# Patient Record
Sex: Male | Born: 1960 | Race: Asian | Hispanic: No | Marital: Married | Smoking: Never smoker
Health system: Southern US, Community
[De-identification: ages and names within clinical notes are randomized; demographics above are authoritative.]

## PROBLEM LIST (undated history)

## (undated) DIAGNOSIS — E785 Hyperlipidemia, unspecified: Secondary | ICD-10-CM

## (undated) DIAGNOSIS — T7840XA Allergy, unspecified, initial encounter: Secondary | ICD-10-CM

## (undated) DIAGNOSIS — Z21 Asymptomatic human immunodeficiency virus [HIV] infection status: Secondary | ICD-10-CM

## (undated) DIAGNOSIS — J302 Other seasonal allergic rhinitis: Secondary | ICD-10-CM

## (undated) DIAGNOSIS — B20 Human immunodeficiency virus [HIV] disease: Secondary | ICD-10-CM

## (undated) DIAGNOSIS — J479 Bronchiectasis, uncomplicated: Secondary | ICD-10-CM

## (undated) DIAGNOSIS — R918 Other nonspecific abnormal finding of lung field: Secondary | ICD-10-CM

## (undated) HISTORY — DX: Other nonspecific abnormal finding of lung field: R91.8

## (undated) HISTORY — DX: Allergy, unspecified, initial encounter: T78.40XA

## (undated) HISTORY — DX: Hyperlipidemia, unspecified: E78.5

## (undated) HISTORY — DX: Bronchiectasis, uncomplicated: J47.9

## (undated) HISTORY — DX: Human immunodeficiency virus (HIV) disease: B20

## (undated) HISTORY — DX: Asymptomatic human immunodeficiency virus (hiv) infection status: Z21

---

## 2010-11-16 DIAGNOSIS — E119 Type 2 diabetes mellitus without complications: Secondary | ICD-10-CM | POA: Insufficient documentation

## 2010-11-16 DIAGNOSIS — E781 Pure hyperglyceridemia: Secondary | ICD-10-CM | POA: Insufficient documentation

## 2011-07-27 ENCOUNTER — Emergency Department (HOSPITAL_COMMUNITY)
Admission: EM | Admit: 2011-07-27 | Discharge: 2011-07-28 | Disposition: A | Discharge status: Home / Self Care | Payer: BC Managed Care – PPO | Attending: Emergency Medicine | Admitting: Emergency Medicine

## 2011-07-27 ENCOUNTER — Ambulatory Visit (INDEPENDENT_AMBULATORY_CARE_PROVIDER_SITE_OTHER): Payer: BC Managed Care – PPO | Admitting: Emergency Medicine

## 2011-07-27 ENCOUNTER — Encounter (HOSPITAL_COMMUNITY): Payer: Self-pay | Admitting: *Deleted

## 2011-07-27 VITALS — BP 104/70 | HR 68 | Temp 98.6°F | Resp 16 | Ht 64.0 in | Wt 153.4 lb

## 2011-07-27 DIAGNOSIS — Z880 Allergy status to penicillin: Secondary | ICD-10-CM | POA: Insufficient documentation

## 2011-07-27 DIAGNOSIS — R209 Unspecified disturbances of skin sensation: Secondary | ICD-10-CM | POA: Insufficient documentation

## 2011-07-27 DIAGNOSIS — G5610 Other lesions of median nerve, unspecified upper limb: Secondary | ICD-10-CM

## 2011-07-27 DIAGNOSIS — R200 Anesthesia of skin: Secondary | ICD-10-CM

## 2011-07-27 DIAGNOSIS — E119 Type 2 diabetes mellitus without complications: Secondary | ICD-10-CM | POA: Insufficient documentation

## 2011-07-27 HISTORY — DX: Other seasonal allergic rhinitis: J30.2

## 2011-07-27 LAB — POCT I-STAT, CHEM 8
BUN: 17 mg/dL (ref 6–23)
Potassium: 3.9 mEq/L (ref 3.5–5.1)
Sodium: 136 mEq/L (ref 135–145)
TCO2: 27 mmol/L (ref 0–100)

## 2011-07-27 LAB — GLUCOSE, CAPILLARY: Glucose-Capillary: 240 mg/dL — ABNORMAL HIGH (ref 70–99)

## 2011-07-27 NOTE — ED Notes (Signed)
Pt c/o bilateral hand numbness since 6 am this morning, numb on the interior aspects only.  No weakness, facial droop, arm drift, slurred speech.

## 2011-07-27 NOTE — ED Notes (Addendum)
Pt up to desk to complain about delays.  Apologized for delays.  Encouraged pt to stay.  Explained several times triage process and importance of staying to be seen.  Pt deciding if he will stay or leave.

## 2011-07-27 NOTE — ED Provider Notes (Signed)
History     CSN: [id number]  Arrival date & time 07/27/11  2013   First MD Initiated Contact with Patient 07/27/11 2255      Chief Complaint  Patient presents with  . Numbness    (Consider location/radiation/quality/duration/timing/severity/associated sxs/prior treatment) HPI Comments: 51 year old male with history of diabetes who takes metformin for hyperglycemia more days than  Not.  He presents with a complaint of bilateral hand numbness right greater than left onset with awakening this morning approximately 16 hours ago. Initially the symptoms were intense but over the course of the day they have gradually improved and currently his symptoms are very mild. He describes the numbness as palmar of bilateral hands, involving the thumb, point her finger and middle finger. There is no involvement of the ring or pinkie fingers. He denies any changes in vision, numbness or weakness of the legs or weakness of the arms. He works on a Geographical information systems officer and has been able to work all day without any difficulty with using his fingers with respect coordination or strength. There is been no change in gait, balance, hearing and has no vertigo or headache. He denies chest pain, palpitations, shortness of breath, coughing, sore throat, rash, diarrhea, dysuria. He denies recent travel, fevers or chills. He has never had any symptoms like this in the past. He suspects that when he slept last night he slept on his arms in an abnormal way. He denies any alcohol use or tobacco use or drug use. He denies over-the-counter medications but does take occasional Zyrtec for allergies.  The history is provided by the patient.    Past Medical History  Diagnosis Date  . Diabetes mellitus   . Seasonal allergies     History reviewed. No pertinent past surgical history.  History reviewed. No pertinent family history.  History  Substance Use Topics  . Smoking status: Never Smoker   . Smokeless tobacco: Not on file    . Alcohol Use: No      Review of Systems  All other systems reviewed and are negative.    Allergies  Amoxicillin  Home Medications   Current Outpatient Rx  Name Route Sig Dispense Refill  . CETIRIZINE HCL 10 MG PO TABS Oral Take 10 mg by mouth daily as needed. For allergy symptoms      BP 116/86  Pulse 75  Temp 97.7 F (36.5 C) (Oral)  Resp 18  SpO2 99%  Physical Exam  Nursing note and vitals reviewed. Constitutional: He appears well-developed and well-nourished. No distress.  HENT:  Head: Normocephalic and atraumatic.  Mouth/Throat: Oropharynx is clear and moist. No oropharyngeal exudate.  Eyes: Conjunctivae and EOM are normal. Pupils are equal, round, and reactive to light. Right eye exhibits no discharge. Left eye exhibits no discharge. No scleral icterus.  Neck: Normal range of motion. Neck supple. No JVD present. No thyromegaly present.  Cardiovascular: Normal rate, regular rhythm, normal heart sounds and intact distal pulses.  Exam reveals no gallop and no friction rub.   No murmur heard. Pulmonary/Chest: Effort normal and breath sounds normal. No respiratory distress. He has no wheezes. He has no rales.  Abdominal: Soft. Bowel sounds are normal. He exhibits no distension and no mass. There is no tenderness.  Musculoskeletal: Normal range of motion. He exhibits no edema and no tenderness.  Lymphadenopathy:    He has no cervical adenopathy.  Neurological: He is alert. Coordination normal.       Neurologic exam:  Speech clear, pupils equal round  reactive to light, extraocular movements intact  Normal peripheral visual fields Cranial nerves III through XII normal including no facial droop Follows commands, moves all extremities x4, normal strength to bilateral upper and lower extremities at all major muscle groups including grip Sensation normal to light touch and pinprick Coordination intact, no limb ataxia, finger-nose-finger normal Rapid alternating  movements normal No pronator drift Gait normal  There is no change in sensation between the right and left hands, all fingers and the palms feel the same bilaterally and he denies any numbness to light touch or pinprick. Lower extremities with normal sensation, position sense and temperature sensation in all dermatomes.  Skin: Skin is warm and dry. No rash noted. No erythema.  Psychiatric: He has a normal mood and affect. His behavior is normal.    ED Course  Procedures (including critical care time)  Labs Reviewed  GLUCOSE, CAPILLARY - Abnormal; Notable for the following:    Glucose-Capillary 240 (*)     All other components within normal limits  POCT I-STAT, CHEM 8 - Abnormal; Notable for the following:    Glucose, Bld 280 (*)     All other components within normal limits   No results found.   1. Numbness of fingers of both hands       MDM  The patient appears well, has normal vital signs and has no focal neurologic deficits on my exam. We'll check his potassium level but at this time with bilateral hand symptoms there is not appear to be any focal deficits with which would be concerning for a stroke. He would benefit from neurology referral should his symptoms persist or change and I have discussed with him at length the need to return to the emergency department for unilateral or severe or worsening symptoms. Again he has no weakness or numbness in the dermatomal patterns of the upper extremities or the radial ulnar or median nerves bilaterally.  Vital signs remain normal, laboratory data reveals no hypokalemia, no renal dysfunction and mild to moderate hyperglycemia. The patient is having no other symptoms including no nausea, no weakness and has no other focal neurologic deficits. I explained to him in detail the indications for return and he has agreed.      Vida Roller, MD 07/28/11 [phone]

## 2011-07-27 NOTE — ED Notes (Signed)
Pt has decided to stay and be seen.

## 2011-07-27 NOTE — Progress Notes (Signed)
  Subjective:    Patient ID: Marciano Mundt, male    DOB: 05-Jan-1960, 51 y.o.   MRN: [id number]  HPI Comments: Awoke this morning as normal and noticed that both his hands were numb in a median nerve distribution.  His left hand improved but not fully through the day.  The right has not.  He has no history of injury or overuse.  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  No other neuro or motor symptoms.  No visual symptoms.  Has not history of antecedent illness or injury.  Has two month history of pain in right shoulder posteriorly that comes and goes.  Hand Pain  The incident occurred 6 to 12 hours ago. The incident occurred at home. There was no injury mechanism. The pain is present in the right hand. The quality of the pain is described as aching. The pain does not radiate. The pain is at a severity of 2/10. The pain is mild. The pain has been improving since the incident. Associated symptoms include muscle weakness, numbness and tingling. Pertinent negatives include no chest pain. Nothing aggravates the symptoms. He has tried nothing for the symptoms.      Review of Systems  HENT: Negative.   Eyes: Negative.   Respiratory: Negative.   Cardiovascular: Negative.  Negative for chest pain.  Gastrointestinal: Negative.   Genitourinary: Negative.   Musculoskeletal: Negative.   Neurological: Positive for tingling, weakness and numbness. Negative for dizziness, tremors, seizures, syncope, facial asymmetry, speech difficulty, light-headedness and headaches.       Objective:   Physical Exam  Constitutional: He is oriented to person, place, and time. He appears well-developed and well-nourished.  HENT:  Head: Normocephalic and atraumatic.  Eyes: Conjunctivae are normal. Pupils are equal, round, and reactive to light.  Neck: Normal range of motion.  Cardiovascular: Normal rate and regular rhythm.   Pulmonary/Chest: Effort normal and breath sounds normal.    Musculoskeletal: Normal range of motion. He exhibits tenderness (trapezius right posterior shoulder).  Neurological: He is alert and oriented to person, place, and time. He has normal reflexes. No cranial nerve deficit. He exhibits normal muscle tone. Coordination normal.       phalen and tinnels negative  Skin: Skin is warm and dry.          Assessment & Plan:  Median neuropathy bilaterally To ER for evaluation.

## 2011-07-28 NOTE — ED Notes (Signed)
Pt discharged home in good condition. 

## 2013-11-19 ENCOUNTER — Other Ambulatory Visit: Payer: Self-pay | Admitting: Family Medicine

## 2013-11-19 DIAGNOSIS — R9389 Abnormal findings on diagnostic imaging of other specified body structures: Secondary | ICD-10-CM

## 2014-04-02 ENCOUNTER — Other Ambulatory Visit: Payer: Self-pay

## 2014-05-15 LAB — BASIC METABOLIC PANEL
BUN: 9 mg/dL (ref 4–21)
Creatinine: 1.1 mg/dL (ref 0.6–1.3)
Glucose: 150 mg/dL
Potassium: 4.4 mmol/L (ref 3.4–5.3)
SODIUM: 135 mmol/L — AB (ref 137–147)

## 2014-05-15 LAB — HEPATIC FUNCTION PANEL
ALT: 20 U/L (ref 10–40)
AST: 21 U/L (ref 14–40)
Alkaline Phosphatase: 82 U/L (ref 25–125)
Bilirubin, Total: 0.3 mg/dL

## 2014-05-15 LAB — LIPID PANEL: CHOLESTEROL: 137 mg/dL (ref 0–200)

## 2014-05-15 LAB — HEMOGLOBIN A1C: Hgb A1c MFr Bld: 7.5 % — AB (ref 4.0–6.0)

## 2014-06-24 ENCOUNTER — Ambulatory Visit: Payer: BLUE CROSS/BLUE SHIELD

## 2014-06-24 DIAGNOSIS — B20 Human immunodeficiency virus [HIV] disease: Secondary | ICD-10-CM

## 2014-06-24 LAB — HEPATITIS A ANTIBODY, IGM: Hep A IgM: NONREACTIVE

## 2014-06-24 LAB — HEPATITIS B CORE ANTIBODY, TOTAL: Hep B Core Total Ab: NONREACTIVE

## 2014-06-24 LAB — HEPATITIS C ANTIBODY: HCV AB: NEGATIVE

## 2014-06-24 LAB — RPR

## 2014-06-25 LAB — T-HELPER CELL (CD4) - (RCID CLINIC ONLY)
CD4 T CELL ABS: 450 /uL (ref 400–2700)
CD4 T CELL HELPER: 25 % — AB (ref 33–55)

## 2014-06-25 LAB — HIV-1 RNA ULTRAQUANT REFLEX TO GENTYP+

## 2014-06-25 NOTE — Progress Notes (Signed)
Patient is transferring from [place] . He was diagnosed with HIV 2006 and is not sure of source of infection. He thinks it was related to a malaria exposure that took place while he was in [place] in 2000. He has been married over 20 years and his wife and 2 children have tested negative.    Patient has concerns about duplicate labs since he has a primary care and diabetes physician.  He has requested we only do a limited number of labs.  I have asked him to assure his routine labs with these physicians be forwarded to our office.   Laurell Josephs, RN

## 2014-06-26 LAB — QUANTIFERON TB GOLD ASSAY (BLOOD)
Interferon Gamma Release Assay: NEGATIVE
MITOGEN VALUE: 2.82 [IU]/mL
Quantiferon Nil Value: 0.07 IU/mL
Quantiferon Tb Ag Minus Nil Value: 0 IU/mL
TB Ag value: 0.07 IU/mL

## 2014-06-27 ENCOUNTER — Inpatient Hospital Stay: Admission: RE | Admit: 2014-06-27 | Payer: Self-pay | Source: Ambulatory Visit

## 2014-07-01 LAB — HLA B*5701: HLA-B*5701 w/rflx HLA-B High: POSITIVE — AB

## 2014-07-14 ENCOUNTER — Ambulatory Visit: Payer: BLUE CROSS/BLUE SHIELD | Admitting: Infectious Disease

## 2014-07-18 ENCOUNTER — Ambulatory Visit
Admission: RE | Admit: 2014-07-18 | Discharge: 2014-07-18 | Disposition: A | Discharge status: Home / Self Care | Payer: BLUE CROSS/BLUE SHIELD | Source: Ambulatory Visit | Attending: Family Medicine | Admitting: Family Medicine

## 2014-07-18 DIAGNOSIS — R9389 Abnormal findings on diagnostic imaging of other specified body structures: Secondary | ICD-10-CM

## 2014-07-18 MED ORDER — IOPAMIDOL (ISOVUE-300) INJECTION 61%
75.0000 mL | Freq: Once | INTRAVENOUS | Status: AC | PRN
  Administered 2014-07-18: 75 mL via INTRAVENOUS

## 2014-07-20 ENCOUNTER — Encounter: Payer: Self-pay | Admitting: Internal Medicine

## 2014-07-20 DIAGNOSIS — B20 Human immunodeficiency virus [HIV] disease: Secondary | ICD-10-CM | POA: Insufficient documentation

## 2014-07-20 DIAGNOSIS — J302 Other seasonal allergic rhinitis: Secondary | ICD-10-CM | POA: Insufficient documentation

## 2014-07-21 ENCOUNTER — Encounter: Payer: Self-pay | Admitting: Internal Medicine

## 2014-07-21 ENCOUNTER — Ambulatory Visit (INDEPENDENT_AMBULATORY_CARE_PROVIDER_SITE_OTHER): Payer: BLUE CROSS/BLUE SHIELD | Admitting: Internal Medicine

## 2014-07-21 VITALS — BP 100/67 | HR 64 | Temp 98.1°F | Resp 14 | Ht 65.0 in | Wt 148.0 lb

## 2014-07-21 DIAGNOSIS — B20 Human immunodeficiency virus [HIV] disease: Secondary | ICD-10-CM | POA: Diagnosis not present

## 2014-07-21 DIAGNOSIS — J302 Other seasonal allergic rhinitis: Secondary | ICD-10-CM

## 2014-07-21 NOTE — Progress Notes (Signed)
Patient ID: Jordan Holmes, male   DOB: [birth year], 54 y.o.   MRN: [id number]          Patient Active Problem List   Diagnosis Date Noted  . HIV disease 07/20/2014    Priority: High  . Seasonal allergies 07/20/2014  . Hypertriglyceridemia 11/16/2010  . Diabetes mellitus, type 2 11/16/2010    Patient's Medications  New Prescriptions   No medications on file  Previous Medications   ATORVASTATIN (LIPITOR) 10 MG TABLET    Take 10 mg by mouth daily.   CETIRIZINE (ZYRTEC) 10 MG TABLET    Take 10 mg by mouth daily as needed. For allergy symptoms   EFAVIRENZ-EMTRICITABINE-TENOFOVIR (ATRIPLA) 600-200-300 MG PER TABLET    Take 1 tablet by mouth at bedtime.   METFORMIN (GLUCOPHAGE) 500 MG TABLET    Take 500 mg by mouth 2 (two) times daily with a meal.   OMEGA-3 FATTY ACIDS (FISH OIL) 1200 MG CPDR    Take 12,000 mg by mouth daily.  Modified Medications   No medications on file  Discontinued Medications   No medications on file    Subjective: Jordan Holmes is in for his initial visit. He was diagnosed with HIV infection and 2006 when he was admitted to [facility] because of unexplained nausea and vomiting. He states that he and his wife were in "shock" when he was first diagnosed and did not believe what the doctors were telling him. He asked that he be retested and he was positive again. He has never had a blood transfusion and he has never used any illicit drugs. He has never had any tattoos. He states that he and his wife have never had any other sexual partners. He was born in [place] but spent 3 years living in [place] where he had malaria and received multiple injections. He wonders if that might have been how he acquired HIV infection. He has no history of any other sexually transmitted diseases or hepatitis.  Shortly after being diagnosed he started on Atripla and has taken it ever since. He rarely misses doses. He takes it just before bedtime. He had crazy dreams when he first  started taking it but tolerates it very well now. He also has type 2 diabetes, elevated cholesterol and seasonal allergies. He has never had any type surgery. He has never used tobacco products or alcohol. 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 His wife and 2 children have been tested and are HIV negative.  Review of Systems: Constitutional: negative Eyes: negative Ears, nose, mouth, throat, and face: negative Respiratory: negative Cardiovascular: negative Gastrointestinal: negative Genitourinary:negative  Past Medical History  Diagnosis Date  . Diabetes mellitus   . Seasonal allergies   . HIV infection   . Allergy   . Hyperlipidemia     History  Substance Use Topics  . Smoking status: Never Smoker   . Smokeless tobacco: Never Used  . Alcohol Use: No    History reviewed. No pertinent family history.  Allergies  Allergen Reactions  . Amoxicillin Nausea And Vomiting    Fever and vomiting Diarrhea & fever    Objective: Temp: 98.1 F (36.7 C) (07/18 0936) Temp Source: Oral (07/18 0936) BP: 100/67 mmHg (07/18 0936) Pulse Rate: 64 (07/18 0936) Body mass index is 24.63 kg/(m^2).  General: He is alert, well dressed and in no distress. His weight is 148 pounds. Oral: No oropharyngeal lesions Skin: No rash Lymph nodes: No palpable adenopathy Lungs: Clear Cor: Regular S1 and S2 with  no murmurs Abdomen: Soft and nontender with no palpable masses. Joints and extremities: Normal Neuro: Normal speech and conversation Mood: Normal and appropriate. He does not appear anxious or depressed.  Lab Results Lab Results  Component Value Date   HGB 16.3 07/27/2011   HCT 48.0 07/27/2011    Lab Results  Component Value Date   CREATININE 1.1 05/15/2014   BUN 9 05/15/2014   NA 135* 05/15/2014   K 4.4 05/15/2014   CL 98 07/27/2011    Lab Results  Component Value Date   ALT 20 05/15/2014   AST 21 05/15/2014   ALKPHOS 82 05/15/2014    Lab Results  Component Value Date    CHOL 137 05/15/2014    Lab Results HIV 1 RNA QUANT (copies/mL)  Date Value  06/24/2014 <20   CD4 T CELL ABS (/uL)  Date Value  06/24/2014 450     Assessment: His HIV infection is under excellent control with Atripla. I talked to him about treatment options that might be safer with newer preparations of tenofovir alafenamide that reduced the risk of nephrotoxicity and osteoporosis associated with the tenofovir component in Atripla. Have given him written information about Genvoya and asked him to consider a switch. For now, he will continue Atripla. I have asked him to try not to miss any doses. I've encouraged him to increase his physical activity. He currently walks a few times each week. He states that he is concerned about decreasing muscle mass.  Plan: 1. Continue Atripla but consider a switch to Genvoya in the near future 2. Follow-up here after blood work in 6 months   Jordan AstersJohn Cataleah Stites, MD [facility] [phone] pager   [phone] cell 07/21/2014, 10:13 AM

## 2014-08-01 ENCOUNTER — Institutional Professional Consult (permissible substitution): Payer: BLUE CROSS/BLUE SHIELD | Admitting: Pulmonary Disease

## 2014-08-14 ENCOUNTER — Other Ambulatory Visit: Payer: BLUE CROSS/BLUE SHIELD

## 2014-08-14 ENCOUNTER — Encounter (INDEPENDENT_AMBULATORY_CARE_PROVIDER_SITE_OTHER): Payer: Self-pay

## 2014-08-14 ENCOUNTER — Ambulatory Visit (INDEPENDENT_AMBULATORY_CARE_PROVIDER_SITE_OTHER): Payer: BLUE CROSS/BLUE SHIELD | Admitting: Pulmonary Disease

## 2014-08-14 ENCOUNTER — Encounter: Payer: Self-pay | Admitting: Pulmonary Disease

## 2014-08-14 VITALS — BP 100/68 | HR 72 | Ht 65.0 in | Wt 147.0 lb

## 2014-08-14 DIAGNOSIS — R918 Other nonspecific abnormal finding of lung field: Secondary | ICD-10-CM | POA: Diagnosis not present

## 2014-08-14 DIAGNOSIS — J479 Bronchiectasis, uncomplicated: Secondary | ICD-10-CM

## 2014-08-14 DIAGNOSIS — R062 Wheezing: Secondary | ICD-10-CM | POA: Insufficient documentation

## 2014-08-14 LAB — RHEUMATOID FACTOR: Rhuematoid fact SerPl-aCnc: <10 IU/mL (ref <=14)

## 2014-08-14 NOTE — Progress Notes (Signed)
Subjective:    Patient ID: Jordan Holmes, male    DOB: September 15, 1960, 54 y.o.   MRN: [id number]  HPI He reports he recently moved from [place], [place], in August 2015. He reports for the last couple months he has been noticing wheezing with ambulation. He reports he started taking Zyrtec without significant relief. He reports he has also had wheezing if exposed to fresh cut grass. He reports a rare, intermittent cough. He rep////orts it is nearly always nonproductive. Rarely does it produce a yellow mucus. No hemoptysis/He denies any nocturnal awakenings with coughing or wheezing. He denies any dyspnea except with significant exertion. No chest pain, pressure, or tightness. No fever, chills, or sweats. He reports some mild dry skin but no rash. No joint swelling or erythema. He does have some mild left knee pain. No morning stiffness. He does have some seasonal sinus congestion. No epistaxis. No dysphagia or odynophagia. He reports he was prescribed an inhaler and feels it may be helping some. He denies any history of bronchitis. No prior history of pneumonia.   Review of Systems He denies any reflux, dyspepsia, or morning brash water taste. No dysuria or hematuria. A pertinent 14 point review of systems is negative except as per the history of presenting illness.  Allergies  Allergen Reactions  . Amoxicillin Nausea And Vomiting    Fever and vomiting Diarrhea & fever   Current Outpatient Prescriptions on File Prior to Visit  Medication Sig Dispense Refill  . atorvastatin (LIPITOR) 10 MG tablet Take 10 mg by mouth daily.    . cetirizine (ZYRTEC) 10 MG tablet Take 10 mg by mouth daily as needed. For allergy symptoms    . efavirenz-emtricitabine-tenofovir (ATRIPLA) 600-200-300 MG per tablet Take 1 tablet by mouth at bedtime.    . metFORMIN (GLUCOPHAGE) 500 MG tablet Take 500 mg by mouth 2 (two) times daily with a meal.    . Omega-3 Fatty Acids (FISH OIL) 1200 MG CPDR Take 12,000 mg by mouth  daily.     No current facility-administered medications on file prior to visit.   Past Medical History  Diagnosis Date  . Diabetes mellitus   . Seasonal allergies   . HIV infection   . Allergy   . Hyperlipidemia    No past surgical history on file.   Family History  Problem Relation Age of Onset  . Lung disease Neg Hx   . Rheumatologic disease Neg Hx     Social History   Social History  . Marital Status: Married    Spouse Name: N/A  . Number of Children: 2  . Years of Education: N/A   Occupational History  . IT    Social History Main Topics  . Smoking status: Never Smoker   . Smokeless tobacco: Never Used     Comment: Second-hand exposure through his father  . Alcohol Use: No  . Drug Use: No  . Sexual Activity:    Partners: Female    Patent examiner Protection: Condom   Other Topics Concern  . None   Social History Narrative   Originally from [place]. He move to the [place] in 2005. He has also lived in [place], [place], and also [place]. He has traveled all over the [place]. No pets currently. No bird exposure. No mold or hot tub exposure. He currently works in Engineer, technical sales. Enjoys playing tennis & walking.      Objective:   Physical Exam Blood pressure 100/68, pulse 72, height '5\' 5"'  (1.651 m),  weight 147 lb (66.679 kg), SpO2 100 %. General:  Awake. Alert. No acute distress.  Integument:  Warm & dry. No rash on exposed skin. No bruising. Lymphatics:  No appreciated cervical or supraclavicular lymphadenoapthy. HEENT:  Moist mucus membranes. No oral ulcers. No scleral injection or icterus. PERRL. Cardiovascular:  Regular rate. No edema. No appreciable JVD.  Pulmonary:  Focal wheezing in left upper lung.  Otherwise good aeration & clear to auscultation bilaterally. Symmetric chest wall expansion. No accessory muscle use. Abdomen: Soft. Normal bowel sounds. Nondistended. Grossly nontender. Musculoskeletal:  Normal bulk and tone. Hand grip strength 5/5 bilaterally. No joint deformity  or effusion appreciated. Neurological:  CN 2-12 grossly in tact. No meningismus. Moving all 4 extremities equally. Symmetric patellar deep tendon reflexes. Psychiatric:  Mood and affect congruent. Speech normal rhythm, rate & tone.   IMAGING CT CHEST W/ 07/18/14 (personally reviewed by me): Tree-in-bud nodular opacities in superior segment left lower lobe as well as left upper lobe bronchiectasis and groundglass nodular opacities. No pleural effusion or thickening. No pathologic mediastinal adenopathy. No pericardial effusion. 2.1 cm enhancing mass within the liver possibly representing hemangioma.  LABS 06/24/14 Quantiferon TB:  Negative HLA-[id number]:  Positive CD4:  56    Assessment & Plan:  54 year old male currently on treatment for HIV with a normal CD4 count. I reviewed his CT scan with him today which does show focal bronchiectasis and nodularity within his left upper lobe and left lower lobe. An atypical infectious process is certainly possible. Less likely is an autoimmune process such as Wegener's, rheumatoid arthritis, Sjogren's, etc. Given his prior residence in [place] and I feel that evaluation for possible Coccidioides infection is reasonable. However, he denies any significant sinus symptoms. We did briefly discuss bronchoscopy with airway inspection and lavage but the patient is hesitant to undergo the procedure and would prefer to get test results back and discuss it again with his wife at follow-up appointment. I instructed the patient to contact my office if he developed any new symptoms or had any further questions before his next appointment.  1. Multiple lung nodules: Plan to check serum ANCA panel, ANA with reflex, rheumatoid factor, anti-CCP, serum Coccidioides antibodies, urine Histoplasma antigen, & urine Blastomyces antigen. Patient to consider bronchoscopy with lavage and next appointment. 2. Focal bronchiectasis: Checking quantitative immunoglobulin panel as well as  autoimmune workup as noted in #1. 3. Wheezing: Suspect secondary to focal bronchiectasis. 4. Follow-up: Patient to return to clinic in 2-4 weeks or sooner if needed.

## 2014-08-14 NOTE — Patient Instructions (Signed)
1.  We are giving you a cup for a culture of your sputum if you're able to produce it. 2.  I'm doing blood and urine tests today to determine if we can figure out what could be causing the white spots in your lungs. 3.  We will discuss your tests results at your next appointment and also whether we need to do a bronchoscopy to get a culture of your lungs. 4.  Please call my office if you have any new symptoms. 5.  I will see you back in 2-4 weeks to go over your results.

## 2014-08-15 LAB — IGG, IGA, IGM
IGM, SERUM: 29 mg/dL — AB (ref 41–251)
IgA: 447 mg/dL — ABNORMAL HIGH (ref 68–379)
IgG (Immunoglobin G), Serum: 1490 mg/dL (ref 650–1600)

## 2014-08-15 LAB — CYCLIC CITRUL PEPTIDE ANTIBODY, IGG: Cyclic Citrullin Peptide Ab: <2 U/mL (ref 0.0–5.0)

## 2014-08-15 LAB — ANCA SCREEN W REFLEX TITER: ANCA Screen: NEGATIVE

## 2014-08-15 LAB — ANA W/REFLEX: Anti Nuclear Antibody(ANA): NEGATIVE

## 2014-08-17 LAB — HISTOPLASMA ANTIGEN, URINE: Histoplasma Antigen, urine: <0.5 ng/mL

## 2014-08-18 LAB — COCCIDIOIDES ANTIBODIES: Coccidioides Ab CF: <1:2 {titer}

## 2014-08-28 LAB — MVISTA BLASTOMYCES QNT AG, URINE
Interpretation: NEGATIVE
RESULT (NG/ML) MVISTA BLASTO QNT AG: NOT DETECTED ng/mL

## 2015-04-07 DIAGNOSIS — E782 Mixed hyperlipidemia: Secondary | ICD-10-CM | POA: Diagnosis not present

## 2015-04-07 DIAGNOSIS — E1165 Type 2 diabetes mellitus with hyperglycemia: Secondary | ICD-10-CM | POA: Diagnosis not present

## 2015-04-07 DIAGNOSIS — E559 Vitamin D deficiency, unspecified: Secondary | ICD-10-CM | POA: Diagnosis not present

## 2015-04-09 DIAGNOSIS — R062 Wheezing: Secondary | ICD-10-CM | POA: Diagnosis not present

## 2015-05-27 DIAGNOSIS — J45909 Unspecified asthma, uncomplicated: Secondary | ICD-10-CM | POA: Diagnosis not present

## 2015-06-24 ENCOUNTER — Encounter: Payer: Self-pay | Admitting: Pulmonary Disease

## 2015-06-24 ENCOUNTER — Telehealth: Payer: Self-pay | Admitting: Pulmonary Disease

## 2015-06-24 ENCOUNTER — Ambulatory Visit (INDEPENDENT_AMBULATORY_CARE_PROVIDER_SITE_OTHER): Payer: BLUE CROSS/BLUE SHIELD | Admitting: Pulmonary Disease

## 2015-06-24 VITALS — BP 118/64 | HR 79 | Ht 65.0 in | Wt 148.0 lb

## 2015-06-24 DIAGNOSIS — R062 Wheezing: Secondary | ICD-10-CM | POA: Diagnosis not present

## 2015-06-24 DIAGNOSIS — J479 Bronchiectasis, uncomplicated: Secondary | ICD-10-CM

## 2015-06-24 DIAGNOSIS — R918 Other nonspecific abnormal finding of lung field: Secondary | ICD-10-CM

## 2015-06-24 NOTE — Progress Notes (Signed)
Subjective:    Patient ID: Jordan Holmes, male    DOB: [birth year], 55 y.o.   MRN: [id number]  C.C.:  Follow-up for Wheezing, Bilateral Lung Nodules & LUL Bronchiectasis.  HPI  Wheezing:  Reports he was treated with steroids for 3 days that seemed to help his wheezing. He was started on Qvar once daily as well. He started to notice symptoms around March-April. He also had a "yellow" mucus that is very thick. He reports that wheezing occurs more with exertion.   Bilateral Lung Nodules:  Autoimmune and infectious workup was negative from last appointment.  LUL Bronchiectasis:  Intermittent coughing without hemoptysis. Denies any recent antibiotic treatments.   Review of Systems No chest pain or pressure. No fever, chills, or sweats. No nausea, emesis, or reflux. Denies any sinus congestion or pressure.  Allergies  Allergen Reactions  . Amoxicillin Nausea And Vomiting    Fever and vomiting Diarrhea & fever   Current Outpatient Prescriptions on File Prior to Visit  Medication Sig Dispense Refill  . atorvastatin (LIPITOR) 10 MG tablet Take 10 mg by mouth daily.    . cetirizine (ZYRTEC) 10 MG tablet Take 10 mg by mouth daily as needed. For allergy symptoms    . efavirenz-emtricitabine-tenofovir (ATRIPLA) 600-200-300 MG per tablet Take 1 tablet by mouth at bedtime.    . metFORMIN (GLUCOPHAGE) 500 MG tablet Take 500 mg by mouth 2 (two) times daily with a meal.    . Omega-3 Fatty Acids (FISH OIL) 1200 MG CPDR Take 12,000 mg by mouth daily.    Marland Kitchen albuterol (PROVENTIL HFA;VENTOLIN HFA) 108 (90 BASE) MCG/ACT inhaler Inhale 2 puffs into the lungs every 6 (six) hours as needed for wheezing or shortness of breath. Reported on 06/24/2015     No current facility-administered medications on file prior to visit.   Past Medical History  Diagnosis Date  . Diabetes mellitus   . Seasonal allergies   . HIV infection ([place])   . Allergy   . Hyperlipidemia   . Bronchiectasis ([place])   . Multiple lung nodules  on CT    History reviewed. No pertinent past surgical history.   Family History  Problem Relation Age of Onset  . Lung disease Neg Hx   . Rheumatologic disease Neg Hx     Social History   Social History  . Marital Status: Married    Spouse Name: N/A  . Number of Children: 2  . Years of Education: N/A   Occupational History  . IT    Social History Main Topics  . Smoking status: Never Smoker   . Smokeless tobacco: Never Used     Comment: Second-hand exposure through his father  . Alcohol Use: No  . Drug Use: No  . Sexual Activity:    Partners: Female    Patent examiner Protection: Condom   Other Topics Concern  . None   Social History Narrative   Originally from [place]. He move to the [place] in 2005. He has also lived in [place], [place], and also [place]. He has traveled all over the [place]. No pets currently. No bird exposure. No mold or hot tub exposure. He currently works in Engineer, technical sales. Enjoys playing tennis & walking.      Objective:   Physical Exam BP 118/64 mmHg  Pulse 79  Ht '5\' 5"'  (1.651 m)  Wt 148 lb (67.132 kg)  BMI 24.63 kg/m2  SpO2 100% General:  Awake. Alert. No distress.  Integument:  Warm & dry. No rash  on exposed skin.  Lymphatics:  No appreciated cervical or supraclavicular lymphadenoapthy. HEENT:  Moist mucus membranes. No oral ulcers. Mild bilateral nasal turbinate swelling. Cardiovascular:  Regular rate. No edema. Normal S1 & S2.  Pulmonary:  Mild bilateral wheezing. Good aeration bilaterally. Normal work of breathing on room air.   IMAGING CT CHEST W/ 07/18/14 (previously reviewed by me): Tree-in-bud nodular opacities in superior segment left lower lobe as well as left upper lobe bronchiectasis and groundglass nodular opacities. No pleural effusion or thickening. No pathologic mediastinal adenopathy. No pericardial effusion. 2.1 cm enhancing mass within the liver possibly representing hemangioma.  LABS 08/14/14 IgG: 1419 IgA: 147 IgM: 29 ANA:  Negative RF: <10 Anti-CCP: <2.0 Urine Blastomyces Ag: Negative Urine Histoplasma Ag: Negative Coccidioides Ab: Negative ANCA Screen: Negative  06/24/14 Quantiferon TB:  Negative [id number]:  Positive CD4:  36    Assessment & Plan:  55 year old male with HIV and bilateral lung nodules and left upper lobe bronchiectasis. Suspect patient may have some element of reactive airways disease/asthma with the seasonal onset of his wheezing. Certainly he could have a mild exacerbation of his underlying bronchiectasis but in the absence of infectious symptoms I'm somewhat skeptical. I reviewed his prior serum lab work with him today and remain suspicious about the potential infectious etiology. I instructed the patient to contact my office if he had any new breathing problems or questions before his next appointment as we further workup his lung problems.  1. Wheezing: Checking full pulmonary function testing on or before next appointment. Continuing Qvar and albuterol inhalers as prescribed 2. Bilateral Lung Nodules: Repeat CT chest without contrast. 3. LUL Focal bronchiectasis: Checking sputum culture for AFB, fungus, and bacteria. 4. Follow-up: Patient to return to clinic in 4-6 weeks or sooner if needed.  Sonia Baller Ashok Cordia, M.D. [facility] Pulmonary & Critical Care Pager:  [phone] After 3pm or if no response, call [phone] 4:12 PM 06/24/2015

## 2015-06-24 NOTE — Patient Instructions (Signed)
   Continue taking your albuterol and Qvar inhalers as prescribed.  Early morning sputum specimens are best. Make sure you do not refrigerate the sample. The sample should also be a sample from deep in your lungs.   I will see you back in 4-6 weeks but please call if you have any new breathing problems or questions.  TESTS ORDERED:  1. Full PFT on or before next appointment 2. CT Chest w/o  3. Sputum culture for AFB, fungus, and bacteria

## 2015-06-24 NOTE — Telephone Encounter (Signed)
LABS 08/14/14 IgG: 1419 IgA: 147 IgM: 29 ANA:  Negative RF:  <10 Anti-CCP:  <2.0 Urine Blastomyces Ag:  Negative Urine Histoplasma Ag:  Negative Coccidioides Ab: Negative ANCA Screen:  Negative

## 2015-06-30 ENCOUNTER — Other Ambulatory Visit: Payer: Self-pay | Admitting: *Deleted

## 2015-06-30 ENCOUNTER — Telehealth: Payer: Self-pay | Admitting: Pulmonary Disease

## 2015-06-30 ENCOUNTER — Telehealth: Payer: Self-pay | Admitting: *Deleted

## 2015-06-30 MED ORDER — EFAVIRENZ-EMTRICITAB-TENOFOVIR 600-200-300 MG PO TABS: 1.0000 | ORAL_TABLET | Freq: Every day | ORAL | Status: DC

## 2015-06-30 NOTE — Telephone Encounter (Signed)
Please fax orders for CBC, complete metabolic panel, CD4 count, HIV viral load, lipid panel and RPR to [facility].

## 2015-06-30 NOTE — Telephone Encounter (Signed)
Called spoke with pt. He states that he went to [facility] and they were unsure if the labs were urine or sputum. I explained to him that they were sputum cultures and that he could come to our lab for the cultures. He states that he is unable to come before 5:30 today but will stop by tomorrow. Pt voiced understanding and had no further questions. Nothing further needed.

## 2015-06-30 NOTE — Telephone Encounter (Signed)
Patient called, asking for a refill.  He was last seen June 2016.  He states he thought he would get a reminder call to schedule in 6 months.  He is now scheduled for an office visit 7/11 with Dr. Orvan Falconerampbell.  He states he needs to use [facility] for his labwork per insurance.  Please advise if RN can fax lab orders to [facility] office near patient of if patient should have labs drawn at his office visit.  He would prefer to have the results available for the visit. RN sent 90 day supply of Atripla to [facility] per patient's request.  Andree CossHowell, Anica Alcaraz M, RN

## 2015-07-01 NOTE — Telephone Encounter (Signed)
[place] [facility] [place] per [place]'s request. Andree CossHowell, Gerell Fortson M, RN

## 2015-07-02 DIAGNOSIS — B20 Human immunodeficiency virus [HIV] disease: Secondary | ICD-10-CM | POA: Diagnosis not present

## 2015-07-02 DIAGNOSIS — Z113 Encounter for screening for infections with a predominantly sexual mode of transmission: Secondary | ICD-10-CM | POA: Diagnosis not present

## 2015-07-02 DIAGNOSIS — Z79899 Other long term (current) drug therapy: Secondary | ICD-10-CM | POA: Diagnosis not present

## 2015-07-03 ENCOUNTER — Inpatient Hospital Stay: Admission: RE | Admit: 2015-07-03 | Payer: BLUE CROSS/BLUE SHIELD | Source: Ambulatory Visit

## 2015-07-06 ENCOUNTER — Telehealth: Payer: Self-pay | Admitting: Pulmonary Disease

## 2015-07-06 NOTE — Telephone Encounter (Signed)
Will forward to Dr. Jamison NeighborNestor so he is aware pt cancelled CT and did not r/s.

## 2015-07-08 ENCOUNTER — Inpatient Hospital Stay: Admission: RE | Admit: 2015-07-08 | Payer: BLUE CROSS/BLUE SHIELD | Source: Ambulatory Visit

## 2015-07-09 NOTE — Telephone Encounter (Signed)
Please call the patient and find out why. Thanks.

## 2015-07-10 DIAGNOSIS — Z0000 Encounter for general adult medical examination without abnormal findings: Secondary | ICD-10-CM | POA: Diagnosis not present

## 2015-07-14 ENCOUNTER — Ambulatory Visit (INDEPENDENT_AMBULATORY_CARE_PROVIDER_SITE_OTHER): Payer: BLUE CROSS/BLUE SHIELD | Admitting: Internal Medicine

## 2015-07-14 ENCOUNTER — Encounter: Payer: Self-pay | Admitting: Internal Medicine

## 2015-07-14 VITALS — BP 105/70 | HR 81 | Temp 97.9°F | Ht 65.0 in | Wt 148.0 lb

## 2015-07-14 DIAGNOSIS — B20 Human immunodeficiency virus [HIV] disease: Secondary | ICD-10-CM | POA: Diagnosis not present

## 2015-07-14 MED ORDER — EFAVIRENZ-EMTRICITAB-TENOFOVIR 600-200-300 MG PO TABS: 1.0000 | ORAL_TABLET | Freq: Every day | ORAL | Status: DC

## 2015-07-14 NOTE — Assessment & Plan Note (Addendum)
Patient is in good spirits and feeling well today.  His HIV remains under great control and extremely compliant to his ART.  Commended patient for his compliance and encouraged him to continue.  Discussed new and safer antiretrovirals (Odefsey vs Genvoya vs Atripla).  Additionally, patient seen by pharmacist to discuss side effects, drug-drug interactions, and cost of newer regimens.  However, patient would like to think his options, so for now will continue on Atripla.  He is scheduled to follow-up in 6 months with labs prior.

## 2015-07-14 NOTE — Progress Notes (Signed)
   Subjective:    Patient ID: Jordan Holmes, male    DOB: 05/13/1960, 55 y.o.   MRN: [id number]  HPI Mr. Jordan Keasaidu Gut is a 55 year old male diagnosed with HIV in 2006.  He was last seen on July 21, 2014.  He remains on his initial ART, Atripla.  He states that he experienced side effects initially, but after 3 months of being on Atripla the symptoms resolved.  He reports tolerating the medication well.  Additionally, he remains compliant.  States no missed doses in the past 2 weeks, but possibly 1 (if that) missed doses in a month.  He is in good spirits and feeling well today.  He was seen in clinic today for routine follow-up.  Labs [facility] at [facility] on 07/02/15: VL:  <20 CD4:  521 (27.4%) Cr:  1.00 Cholesterol:  158 Triglycerides:  232 LDL:  46 HDL:  39  Review of Systems  Constitutional: Negative for fever and chills.  HENT: Negative for mouth sores and sore throat.   Respiratory: Negative for cough and shortness of breath.   Cardiovascular: Negative for chest pain.  Gastrointestinal: Negative for nausea, abdominal pain, diarrhea and constipation.  Genitourinary: Negative for dysuria and discharge.  Skin: Negative for rash.  Neurological: Negative for syncope and headaches.      Objective:   Physical Exam  Constitutional: He is oriented to person, place, and time. He appears well-developed and well-nourished.  HENT:  Mouth/Throat: No oropharyngeal exudate.  Eyes: Conjunctivae are normal. Pupils are equal, round, and reactive to light.  Cardiovascular: Normal rate, regular rhythm and normal heart sounds.   No murmur heard. Pulmonary/Chest: Effort normal and breath sounds normal. No respiratory distress. He has no wheezes.  Abdominal: Soft. Bowel sounds are normal. He exhibits no distension. There is no tenderness.  Lymphadenopathy:    He has no cervical adenopathy.  Neurological: He is alert and oriented to person, place, and time.  Skin: Skin is warm and dry. No rash noted.    Psychiatric: He has a normal mood and affect.      Assessment & Plan:

## 2015-07-22 ENCOUNTER — Encounter: Payer: Self-pay | Admitting: Internal Medicine

## 2015-07-29 ENCOUNTER — Encounter (HOSPITAL_COMMUNITY): Payer: BLUE CROSS/BLUE SHIELD

## 2015-07-29 ENCOUNTER — Ambulatory Visit: Payer: BLUE CROSS/BLUE SHIELD | Admitting: Pulmonary Disease

## 2015-08-18 DIAGNOSIS — E119 Type 2 diabetes mellitus without complications: Secondary | ICD-10-CM | POA: Diagnosis not present

## 2015-08-18 DIAGNOSIS — Z7984 Long term (current) use of oral hypoglycemic drugs: Secondary | ICD-10-CM | POA: Diagnosis not present

## 2015-08-18 DIAGNOSIS — H2513 Age-related nuclear cataract, bilateral: Secondary | ICD-10-CM | POA: Diagnosis not present

## 2015-08-30 DIAGNOSIS — R05 Cough: Secondary | ICD-10-CM | POA: Diagnosis not present

## 2015-08-30 DIAGNOSIS — R509 Fever, unspecified: Secondary | ICD-10-CM | POA: Diagnosis not present

## 2015-08-31 ENCOUNTER — Telehealth: Payer: Self-pay | Admitting: Pulmonary Disease

## 2015-08-31 ENCOUNTER — Other Ambulatory Visit: Payer: BLUE CROSS/BLUE SHIELD

## 2015-08-31 DIAGNOSIS — J479 Bronchiectasis, uncomplicated: Secondary | ICD-10-CM | POA: Diagnosis not present

## 2015-08-31 DIAGNOSIS — R918 Other nonspecific abnormal finding of lung field: Secondary | ICD-10-CM

## 2015-08-31 NOTE — Telephone Encounter (Signed)
Attempted to contact pt. No answer, no option to leave a message. Will try back.  

## 2015-08-31 NOTE — Telephone Encounter (Signed)
Spoke with pt and given PFT and f/u appt with Dr Jamison NeighborNestor.  Clarified phone number to call for CT scheduling [phone]) Ad[facility]

## 2015-08-31 NOTE — Telephone Encounter (Signed)
[phone] pt calling back °

## 2015-08-31 NOTE — Telephone Encounter (Signed)
[phone], pt cb

## 2015-08-31 NOTE — Telephone Encounter (Signed)
Spoke with pt and he would like to reschedule PFT, CHEST CT and appt with DR Jamison NeighborNestor.  Old order expired for CT.  New order placed and [facility] would [facility] [phone].  [facility] 09/22/15 at 2:00 and will see DR Jamison NeighborNestor 09/22/15 at 3:15.  PT asked to be called back after 3:00 today due to a meeting he is in.

## 2015-09-02 ENCOUNTER — Telehealth: Payer: Self-pay | Admitting: Pulmonary Disease

## 2015-09-02 NOTE — Telephone Encounter (Signed)
Spoke with pt. He is needing a new precert to be done on the CT he is having done. [facility]

## 2015-09-02 NOTE — Telephone Encounter (Signed)
Jordan Miyamotoenise Phelps, Jordan Holmes and I have tried to explain to this pt we cannot schedule his CT [facility].  I called [facility] on 08/31/15 and spoke with Jordan Holmes.  Jordan Holmes @[facility]  states that pt has to be seen by their physicians and the CT has to be ordered by their physicians in order for pt to have CT performed @[facility] .  Pt insisted that his ins told [facility] because his precert with [employer] has expired.  I explained to the pt today that the precert is irrelevant because the reason they will not schedule his CT is because he is not [facility]'s pt. Pt states he talked with someone @[facility] that told him he could have his CT done there.  I advised pt to get me that person's name and direct phone number @[facility]  or have them call our office and we would be glad to schedule his CT [facility] for him. I called back today and spoke with Jordan Holmes at [facility].  She gave me the same info, that pt has to be seen by their physicians and CT ordered by one of them for pt to have his CT done [facility] .

## 2015-09-03 NOTE — Telephone Encounter (Signed)
Pt called back stating he called [facility] with his [employer] rep on the line and was told by Mariana SingleJamie & Rose @[facility]  that they could do his CT there.  Pt states that one of these ladies is supposed to contact me concerning this. However, the info the pt was giving me and the info his [employer] rep who was still on the phone gave me varied.  I advised pt I would wait for someone from [facility] to call me and then proceed from there.

## 2015-09-04 NOTE — Telephone Encounter (Signed)
I received a call from Felizardo HoffmannShauna Thomas (Ancillary Coord) @[facility] on 09/03/15.  She stated that [facility] & apologized that the previous two people I had spoke with had stated otherwise.  She stated that they needed the precert and Order faxed to them and then they would contact the pt to schedule.  Order and precert were faxed to Attn:  Shauna [phone] on 09/03/15.  Shauna left a message on my voicemail this morning stating she received everything and the patient has been scheduled.  Spoke with patient to verify that he is aware of appt for 09/09/15 and he verified this.  I have left a message for Shauna @[facility] to send a copy of patient's report and images on a CD to Dr. Jamison NeighborNestor at our address.

## 2015-09-09 DIAGNOSIS — R918 Other nonspecific abnormal finding of lung field: Secondary | ICD-10-CM | POA: Diagnosis not present

## 2015-09-11 DIAGNOSIS — R509 Fever, unspecified: Secondary | ICD-10-CM | POA: Diagnosis not present

## 2015-09-17 ENCOUNTER — Telehealth: Payer: Self-pay | Admitting: Pulmonary Disease

## 2015-09-17 NOTE — Telephone Encounter (Signed)
Called spoke with pt. He wanted to verify his PFT and OV appt. Nothing further needed

## 2015-09-22 ENCOUNTER — Other Ambulatory Visit (INDEPENDENT_AMBULATORY_CARE_PROVIDER_SITE_OTHER): Payer: BLUE CROSS/BLUE SHIELD

## 2015-09-22 ENCOUNTER — Ambulatory Visit (HOSPITAL_COMMUNITY)
Admission: RE | Admit: 2015-09-22 | Discharge: 2015-09-22 | Disposition: A | Discharge status: Home / Self Care | Payer: BLUE CROSS/BLUE SHIELD | Source: Ambulatory Visit | Attending: Pulmonary Disease | Admitting: Pulmonary Disease

## 2015-09-22 ENCOUNTER — Encounter: Payer: Self-pay | Admitting: Pulmonary Disease

## 2015-09-22 ENCOUNTER — Telehealth: Payer: Self-pay | Admitting: Pulmonary Disease

## 2015-09-22 ENCOUNTER — Ambulatory Visit (INDEPENDENT_AMBULATORY_CARE_PROVIDER_SITE_OTHER): Payer: BLUE CROSS/BLUE SHIELD | Admitting: Pulmonary Disease

## 2015-09-22 VITALS — BP 108/62 | HR 112 | Ht 65.0 in | Wt 139.0 lb

## 2015-09-22 DIAGNOSIS — E1165 Type 2 diabetes mellitus with hyperglycemia: Secondary | ICD-10-CM | POA: Diagnosis not present

## 2015-09-22 DIAGNOSIS — J479 Bronchiectasis, uncomplicated: Secondary | ICD-10-CM

## 2015-09-22 DIAGNOSIS — R062 Wheezing: Secondary | ICD-10-CM

## 2015-09-22 DIAGNOSIS — E782 Mixed hyperlipidemia: Secondary | ICD-10-CM | POA: Diagnosis not present

## 2015-09-22 DIAGNOSIS — R918 Other nonspecific abnormal finding of lung field: Secondary | ICD-10-CM

## 2015-09-22 LAB — PULMONARY FUNCTION TEST
DL/VA % pred: 134 %
DL/VA: 5.79 ml/min/mmHg/L
DLCO UNC % PRED: 95 %
DLCO unc: 24.34 ml/min/mmHg
FEF 25-75 PRE: 1.86 L/s
FEF 25-75 Post: 2.46 L/sec
FEF2575-%Change-Post: 32 %
FEF2575-%PRED-PRE: 65 %
FEF2575-%Pred-Post: 87 %
FEV1-%CHANGE-POST: 9 %
FEV1-%PRED-POST: 74 %
FEV1-%Pred-Pre: 68 %
FEV1-POST: 2.26 L
FEV1-Pre: 2.06 L
FEV1FVC-%Change-Post: 1 %
FEV1FVC-%Pred-Pre: 97 %
FEV6-%Change-Post: 9 %
FEV6-POST: 2.85 L
FEV6-PRE: 2.6 L
FEV6FVC-%CHANGE-POST: 1 %
FVC-%Change-Post: 8 %
FVC-%PRED-PRE: 70 %
FVC-%Pred-Post: 76 %
FVC-POST: 2.86 L
FVC-PRE: 2.65 L
POST FEV6/FVC RATIO: 100 %
PRE FEV6/FVC RATIO: 98 %
Post FEV1/FVC ratio: 79 %
Pre FEV1/FVC ratio: 78 %
RV % pred: 145 %
RV: 2.68 L
TLC % PRED: 93 %
TLC: 5.54 L

## 2015-09-22 LAB — BASIC METABOLIC PANEL
BUN: 8 mg/dL (ref 6–23)
CHLORIDE: 100 meq/L (ref 96–112)
CO2: 28 meq/L (ref 19–32)
Calcium: 8.8 mg/dL (ref 8.4–10.5)
Creatinine, Ser: 1.15 mg/dL (ref 0.40–1.50)
GFR: 70.53 mL/min (ref >60.00)
GLUCOSE: 164 mg/dL — AB (ref 70–99)
POTASSIUM: 3.6 meq/L (ref 3.5–5.1)
SODIUM: 134 meq/L — AB (ref 135–145)

## 2015-09-22 LAB — CBC WITH DIFFERENTIAL/PLATELET
BASOS ABS: 0.1 10*3/uL (ref 0.0–0.1)
Basophils Relative: 0.7 % (ref 0.0–3.0)
EOS ABS: 1.3 10*3/uL — AB (ref 0.0–0.7)
Eosinophils Relative: 12.9 % — ABNORMAL HIGH (ref 0.0–5.0)
HCT: 38.9 % — ABNORMAL LOW (ref 39.0–52.0)
HEMOGLOBIN: 12.7 g/dL — AB (ref 13.0–17.0)
Lymphocytes Relative: 22.6 % (ref 12.0–46.0)
Lymphs Abs: 2.3 10*3/uL (ref 0.7–4.0)
MCHC: 32.7 g/dL (ref 30.0–36.0)
MCV: 72.7 fl — ABNORMAL LOW (ref 78.0–100.0)
MONO ABS: 0.6 10*3/uL (ref 0.1–1.0)
Monocytes Relative: 5.8 % (ref 3.0–12.0)
Neutro Abs: 5.9 10*3/uL (ref 1.4–7.7)
Neutrophils Relative %: 58 % (ref 43.0–77.0)
Platelets: 388 10*3/uL (ref 150.0–400.0)
RBC: 5.35 Mil/uL (ref 4.22–5.81)
RDW: 15.5 % (ref 11.5–15.5)
WBC: 10.1 10*3/uL (ref 4.0–10.5)

## 2015-09-22 MED ORDER — ALBUTEROL SULFATE (2.5 MG/3ML) 0.083% IN NEBU
2.5000 mg | INHALATION_SOLUTION | Freq: Once | RESPIRATORY_TRACT | Status: AC
  Administered 2015-09-22: 2.5 mg via RESPIRATORY_TRACT

## 2015-09-22 MED ORDER — ALBUTEROL SULFATE HFA 108 (90 BASE) MCG/ACT IN AERS: 2.0000 | INHALATION_SPRAY | RESPIRATORY_TRACT | 3 refills | Status: DC | PRN

## 2015-09-22 NOTE — Patient Instructions (Signed)
   Call my office to give us a few days that will work with you and your wife's schedule to have the bronchoscopy done.  Let me know if you have any further questions or concerns.  I will see you back in 4 weeks.  TESTS ORDERED: 1. Serum CBC & BMP today

## 2015-09-22 NOTE — Telephone Encounter (Signed)
IMAGING CT CHEST W/O 09/09/15 (personally reviewed by me):  Nodular opacity within superior segment of left lower lobe as well as lingula. No pleural effusion or thickening appreciated. No pathologic mediastinal adenopathy. No pericardial effusion. These areas correspond to previous nodular areas.000

## 2015-09-22 NOTE — Progress Notes (Signed)
 Subjective:    Patient ID: Jordan Holmes, male    DOB: 07/13/1960, 55 y.o.   MRN: [id number]  C.C.:  Follow-up for Wheezing, Bilateral Lung Nodules & LUL Bronchiectasis.  HPI  Wheezing:  He continues to hear wheezing that has not resolved. He quit taking Qvar thinking that it makes his cough worse. He is not using his Albuterol inhaler. He has been wheezing more at night over the last month.   Bilateral Lung Nodules & LUL Focal Bronchiectasis:  He reports for the last month he has had more coughing. He was treated initially with Levaquin & then Bactrim at a subsequent visit. He reports his cough didn't seem to improve with these courses of antibiotics. He is produces a white to light yellow mucus. Denies any hemoptysis.  Review of Systems He reports he has had fever to 102F. He He does have intermittent chills & sweats. He has lost 4-5 pounds over the last month. He has had increased fatigue and decreased oral intake. Denies any nausea or emesis. No rashes or bruising.  Allergies  Allergen Reactions  . Amoxicillin Nausea And Vomiting    Fever and vomiting Diarrhea & fever   Current Outpatient Prescriptions on File Prior to Visit  Medication Sig Dispense Refill  . atorvastatin (LIPITOR) 10 MG tablet Take 10 mg by mouth daily.    . Cholecalciferol (VITAMIN D3) 3000 units TABS Take 1 tablet by mouth daily.    . efavirenz-emtricitabine-tenofovir (ATRIPLA) 600-200-300 MG tablet Take 1 tablet by mouth at bedtime. 90 tablet 3  . metFORMIN (GLUCOPHAGE) 500 MG tablet Take 500 mg by mouth 2 (two) times daily with a meal.    . Omega-3 Fatty Acids (FISH OIL) 1200 MG CPDR Take 12,000 mg by mouth daily.    . QVAR 80 MCG/ACT inhaler Inhale 2 puffs into the lungs 2 (two) times daily.  4  . albuterol (PROVENTIL HFA;VENTOLIN HFA) 108 (90 BASE) MCG/ACT inhaler Inhale 2 puffs into the lungs every 6 (six) hours as needed for wheezing or shortness of breath. Reported on 06/24/2015    . cetirizine (ZYRTEC) 10  MG tablet Take 10 mg by mouth daily as needed. For allergy symptoms     No current facility-administered medications on file prior to visit.    Past Medical History:  Diagnosis Date  . Allergy   . Bronchiectasis ([facility])   . Diabetes mellitus   . HIV infection ([facility])   . Hyperlipidemia   . Multiple lung nodules on CT   . Seasonal allergies    No past surgical history on file.   Family History  Problem Relation Age of Onset  . Lung disease Neg Hx   . Rheumatologic disease Neg Hx     Social History   Social History  . Marital status: Married    Spouse name: N/A  . Number of children: 2  . Years of education: N/A   Occupational History  . IT    Social History Main Topics  . Smoking status: Never Smoker  . Smokeless tobacco: Never Used     Comment: Second-hand exposure through his father  . Alcohol use No  . Drug use: No  . Sexual activity: Yes    Partners: Female    Birth control/ protection: Condom     Comment: declined condoms   Other Topics Concern  . None   Social History Narrative   Originally from [place]. [place] move [place] the [place]. He has also lived in [place],   [place], and also [place]. He has traveled all over the [place]. No pets currently. No bird exposure. No mold or hot tub exposure. He currently works in IT. Enjoys playing tennis & walking.      Objective:   Physical Exam BP 108/62 (BP Location: Left Arm, Cuff Size: Normal)   Pulse (!) 112   Ht 5' 5" (1.651 m)   Wt 139 lb (63 kg)   SpO2 97%   BMI 23.13 kg/m  General:  Awake. Alert. Comfortable. Integument:  Warm & dry. No rash on exposed skin.  Lymphatics:  No appreciated cervical or supraclavicular lymphadenoapthy. HEENT:  Moist mucus membranes. No oral ulcers. Minimal nasal turbinate swelling. Cardiovascular:  Regular rate & rhythm. No edema. Normal S1 & S2.  Pulmonary:  Clear to auscultation bilaterally. Normal work of breathing on room air. Speaking in complete sentences.  PFT 09/22/15: FVC  2.65 L (70%) FEV1 2.06 L (68%) FEV1/FVC 0.78 FEF 25-75 1.86 L (65%) negative bronchodilator response TLC 5.54 L (83%) RV 145% ERV 57% DLCO uncorrected 95%  IMAGING CT CHEST W/O 09/09/15 (personally reviewed by me):  Nodular opacity within superior segment of left lower lobe as well as lingula. No pleural effusion or thickening appreciated. No pathologic mediastinal adenopathy. No pericardial effusion. These areas correspond to previous nodular areas.  CT CHEST W/ 07/18/14 (previously reviewed by me): Tree-in-bud nodular opacities in superior segment left lower lobe as well as left upper lobe bronchiectasis and groundglass nodular opacities. No pleural effusion or thickening. No pathologic mediastinal adenopathy. No pericardial effusion. 2.1 cm enhancing mass within the liver possibly representing hemangioma.  MICROBIOLOGY Sputum Ctx (08/31/15):  AFB Pending / Fungus Pending  LABS 08/14/14 IgG: 1419 IgA: 147 IgM: 29 ANA: Negative RF: <10 Anti-CCP: <2.0 Urine Blastomyces Ag: Negative Urine Histoplasma Ag: Negative Coccidioides Ab: Negative ANCA Screen: Negative  06/24/14 Quantiferon TB:  Negative HLA-[id number]:  Positive CD4:  450    Assessment & Plan:  55 y.o. male with HIV and bilateral lung nodules and left upper lobe bronchiectasis. I discussed the patient's recent CT chest with him today as well as his symptoms suggestive of an underlying infection. I suspect he does indeed have an atypical infection that is contributing to both his cough and wheezing. His spirometry today shows no evidence of airway obstruction or significant bronchodilator response that would indicate underlying asthma. We did discuss bronchoscopy with lavage and transbronchial biopsies as well as risks of the procedure including bleeding, infection, pneumothorax, medication allergy, vocal cord injury, and potentially death. I instructed the patient to contact my office if he had any further questions or  concerns.  1. Wheezing: Holding on steroid inhalers at this time. Prescribed albuterol inhaler to use as needed. 2. Bilateral Lung Nodules & LUL Focal Bronchiectasis: Patient to discuss bronchoscopy procedure and timing with his wife and then contact my office with dates which would be convenient for the procedure. Taking serum CBC & BMP today. 3. Follow-up: Patient to return to clinic in 4 weeks or sooner if needed.  Khiree Bukhari E. Dennette Faulconer, M.D. [facility] Pager:  [phone] After 3pm or if no response, call [phone] 3:28 PM 09/22/15    

## 2015-09-22 NOTE — Addendum Note (Signed)
Addended by: Tommie SamsSILVA, MINDY S on: 09/22/2015 04:12 PM   Modules accepted: Orders

## 2015-09-24 ENCOUNTER — Telehealth: Payer: Self-pay | Admitting: Pulmonary Disease

## 2015-09-24 NOTE — Telephone Encounter (Signed)
Called spoke with pt. He reports he can do any day next week for the bronch. Please advise Dr. Jamison NeighborNestor thanks

## 2015-09-24 NOTE — Telephone Encounter (Signed)
Called respiratory and they are now closed. WCB in AM  Pt is aware we are working on this.

## 2015-09-24 NOTE — Telephone Encounter (Signed)
Please schedule the bronchoscopy with a lavage and fluoro for transbronchial biopsies on Friday 9/29 after 1pm with Respiratory (doesn't need to be done in OR). Will need to be on TB isolation just as a precaution. Thanks.

## 2015-09-25 NOTE — Telephone Encounter (Signed)
Called Jordan Holmes. Procedure scheduled [facility]29 at 2 PM [facility].  Called made pt aware. He verbalized understanding. Will forward to Dr. Jamison NeighborNestor so he is aware.

## 2015-09-29 DIAGNOSIS — E782 Mixed hyperlipidemia: Secondary | ICD-10-CM | POA: Diagnosis not present

## 2015-09-29 DIAGNOSIS — E1165 Type 2 diabetes mellitus with hyperglycemia: Secondary | ICD-10-CM | POA: Diagnosis not present

## 2015-09-29 DIAGNOSIS — E559 Vitamin D deficiency, unspecified: Secondary | ICD-10-CM | POA: Diagnosis not present

## 2015-10-01 ENCOUNTER — Telehealth: Payer: Self-pay | Admitting: Pulmonary Disease

## 2015-10-01 NOTE — Telephone Encounter (Signed)
Called spoke with pt. He wanted to know the exact cost of his procedure. Advised pt we will not have an exact cost for the procedure and the hospital should let him know once they run his benefits what he would be responsible for. Nothing further needed

## 2015-10-01 NOTE — Telephone Encounter (Signed)
Spoke with the pt  The hospital called him and answered any questions he had  Nothing further needed

## 2015-10-02 ENCOUNTER — Ambulatory Visit (HOSPITAL_COMMUNITY)
Admission: RE | Admit: 2015-10-02 | Discharge: 2015-10-02 | Disposition: A | Discharge status: Home / Self Care | Payer: BLUE CROSS/BLUE SHIELD | Source: Ambulatory Visit | Attending: Pulmonary Disease | Admitting: Pulmonary Disease

## 2015-10-02 ENCOUNTER — Ambulatory Visit (HOSPITAL_COMMUNITY): Payer: BLUE CROSS/BLUE SHIELD

## 2015-10-02 ENCOUNTER — Encounter (HOSPITAL_COMMUNITY): Payer: Self-pay | Admitting: Respiratory Therapy

## 2015-10-02 ENCOUNTER — Encounter (HOSPITAL_COMMUNITY)
Admission: RE | Disposition: A | Discharge status: Home / Self Care | Payer: Self-pay | Source: Ambulatory Visit | Attending: Pulmonary Disease

## 2015-10-02 DIAGNOSIS — R918 Other nonspecific abnormal finding of lung field: Secondary | ICD-10-CM | POA: Diagnosis not present

## 2015-10-02 DIAGNOSIS — E785 Hyperlipidemia, unspecified: Secondary | ICD-10-CM | POA: Insufficient documentation

## 2015-10-02 DIAGNOSIS — Z79899 Other long term (current) drug therapy: Secondary | ICD-10-CM | POA: Insufficient documentation

## 2015-10-02 DIAGNOSIS — J479 Bronchiectasis, uncomplicated: Secondary | ICD-10-CM | POA: Insufficient documentation

## 2015-10-02 DIAGNOSIS — J60 Coalworker's pneumoconiosis: Secondary | ICD-10-CM | POA: Diagnosis not present

## 2015-10-02 DIAGNOSIS — E119 Type 2 diabetes mellitus without complications: Secondary | ICD-10-CM | POA: Insufficient documentation

## 2015-10-02 DIAGNOSIS — Z21 Asymptomatic human immunodeficiency virus [HIV] infection status: Secondary | ICD-10-CM | POA: Diagnosis not present

## 2015-10-02 DIAGNOSIS — R845 Abnormal microbiological findings in specimens from respiratory organs and thorax: Secondary | ICD-10-CM | POA: Diagnosis not present

## 2015-10-02 DIAGNOSIS — R062 Wheezing: Secondary | ICD-10-CM | POA: Diagnosis not present

## 2015-10-02 DIAGNOSIS — B20 Human immunodeficiency virus [HIV] disease: Secondary | ICD-10-CM | POA: Insufficient documentation

## 2015-10-02 DIAGNOSIS — Z9889 Other specified postprocedural states: Secondary | ICD-10-CM

## 2015-10-02 HISTORY — PX: VIDEO BRONCHOSCOPY: SHX5072

## 2015-10-02 LAB — GLUCOSE, CAPILLARY: Glucose-Capillary: 98 mg/dL (ref 65–99)

## 2015-10-02 SURGERY — BRONCHOSCOPY, WITH FLUOROSCOPY
Anesthesia: Moderate Sedation | Laterality: Bilateral

## 2015-10-02 MED ORDER — LIDOCAINE HCL (PF) 1 % IJ SOLN
INTRAMUSCULAR | Status: DC | PRN
  Administered 2015-10-02: 6 mL

## 2015-10-02 MED ORDER — SODIUM CHLORIDE 0.9 % IV SOLN
Freq: Once | INTRAVENOUS | Status: AC
  Administered 2015-10-02: 14:00:00 via INTRAVENOUS

## 2015-10-02 MED ORDER — FENTANYL CITRATE (PF) 100 MCG/2ML IJ SOLN
INTRAMUSCULAR | Status: AC
  Filled 2015-10-02: qty 4

## 2015-10-02 MED ORDER — MIDAZOLAM HCL 10 MG/2ML IJ SOLN
INTRAMUSCULAR | Status: DC | PRN
  Administered 2015-10-02 (×2): 1 mg via INTRAVENOUS
  Administered 2015-10-02: 2 mg via INTRAVENOUS

## 2015-10-02 MED ORDER — MIDAZOLAM HCL 5 MG/ML IJ SOLN
INTRAMUSCULAR | Status: AC
  Filled 2015-10-02: qty 2

## 2015-10-02 MED ORDER — FENTANYL CITRATE (PF) 100 MCG/2ML IJ SOLN
INTRAMUSCULAR | Status: DC | PRN
  Administered 2015-10-02: 25 ug via INTRAVENOUS
  Administered 2015-10-02: 50 ug via INTRAVENOUS
  Administered 2015-10-02: 25 ug via INTRAVENOUS

## 2015-10-02 NOTE — H&P (View-Only) (Signed)
Subjective:    Patient ID: Jordan Holmes, male    DOB: 09/05/1960, 55 y.o.   MRN: [id number]  C.C.:  Follow-up for Wheezing, Bilateral Lung Nodules & LUL Bronchiectasis.  HPI  Wheezing:  He continues to hear wheezing that has not resolved. He quit taking Qvar thinking that it makes his cough worse. He is not using his Albuterol inhaler. He has been wheezing more at night over the last month.   Bilateral Lung Nodules & LUL Focal Bronchiectasis:  He reports for the last month he has had more coughing. He was treated initially with Levaquin & then Bactrim at a subsequent visit. He reports his cough didn't seem to improve with these courses of antibiotics. He is produces a white to light yellow mucus. Denies any hemoptysis.  Review of Systems He reports he has had fever to 102F. He He does have intermittent chills & sweats. He has lost 4-5 pounds over the last month. He has had increased fatigue and decreased oral intake. Denies any nausea or emesis. No rashes or bruising.  Allergies  Allergen Reactions  . Amoxicillin Nausea And Vomiting    Fever and vomiting Diarrhea & fever   Current Outpatient Prescriptions on File Prior to Visit  Medication Sig Dispense Refill  . atorvastatin (LIPITOR) 10 MG tablet Take 10 mg by mouth daily.    . Cholecalciferol (VITAMIN D3) 3000 units TABS Take 1 tablet by mouth daily.    Marland Kitchen efavirenz-emtricitabine-tenofovir (ATRIPLA) 600-200-300 MG tablet Take 1 tablet by mouth at bedtime. 90 tablet 3  . metFORMIN (GLUCOPHAGE) 500 MG tablet Take 500 mg by mouth 2 (two) times daily with a meal.    . Omega-3 Fatty Acids (FISH OIL) 1200 MG CPDR Take 12,000 mg by mouth daily.    Marland Kitchen QVAR 80 MCG/ACT inhaler Inhale 2 puffs into the lungs 2 (two) times daily.  4  . albuterol (PROVENTIL HFA;VENTOLIN HFA) 108 (90 BASE) MCG/ACT inhaler Inhale 2 puffs into the lungs every 6 (six) hours as needed for wheezing or shortness of breath. Reported on 06/24/2015    . cetirizine (ZYRTEC) 10  MG tablet Take 10 mg by mouth daily as needed. For allergy symptoms     No current facility-administered medications on file prior to visit.    Past Medical History:  Diagnosis Date  . Allergy   . Bronchiectasis ([place])   . Diabetes mellitus   . HIV infection ([place])   . Hyperlipidemia   . Multiple lung nodules on CT   . Seasonal allergies    No past surgical history on file.   Family History  Problem Relation Age of Onset  . Lung disease Neg Hx   . Rheumatologic disease Neg Hx     Social History   Social History  . Marital status: Married    Spouse name: N/A  . Number of children: 2  . Years of education: N/A   Occupational History  . IT    Social History Main Topics  . Smoking status: Never Smoker  . Smokeless tobacco: Never Used     Comment: Second-hand exposure through his father  . Alcohol use No  . Drug use: No  . Sexual activity: Yes    Partners: Female    Birth control/ protection: Condom     Comment: declined condoms   Other Topics Concern  . None   Social History Narrative   Originally from [place]. He move to the [place] in 2005. He has also lived in [place],  [place], and also [place]. He has traveled all over the [place]. No pets currently. No bird exposure. No mold or hot tub exposure. He currently works in Engineer, technical sales. Enjoys playing tennis & walking.      Objective:   Physical Exam BP 108/62 (BP Location: Left Arm, Cuff Size: Normal)   Pulse (!) 112   Ht 5' 5" (1.651 m)   Wt 139 lb (63 kg)   SpO2 97%   BMI 23.13 kg/m  General:  Awake. Alert. Comfortable. Integument:  Warm & dry. No rash on exposed skin.  Lymphatics:  No appreciated cervical or supraclavicular lymphadenoapthy. HEENT:  Moist mucus membranes. No oral ulcers. Minimal nasal turbinate swelling. Cardiovascular:  Regular rate & rhythm. No edema. Normal S1 & S2.  Pulmonary:  Clear to auscultation bilaterally. Normal work of breathing on room air. Speaking in complete sentences.  PFT 09/22/15: FVC  2.65 L (70%) FEV1 2.06 L (68%) FEV1/FVC 0.78 FEF 25-75 1.86 L (65%) negative bronchodilator response TLC 5.54 L (83%) RV 145% ERV 57% DLCO uncorrected 95%  IMAGING CT CHEST W/O 09/09/15 (personally reviewed by me):  Nodular opacity within superior segment of left lower lobe as well as lingula. No pleural effusion or thickening appreciated. No pathologic mediastinal adenopathy. No pericardial effusion. These areas correspond to previous nodular areas.  CT CHEST W/ 07/18/14 (previously reviewed by me): Tree-in-bud nodular opacities in superior segment left lower lobe as well as left upper lobe bronchiectasis and groundglass nodular opacities. No pleural effusion or thickening. No pathologic mediastinal adenopathy. No pericardial effusion. 2.1 cm enhancing mass within the liver possibly representing hemangioma.  MICROBIOLOGY Sputum Ctx (08/31/15):  AFB Pending / Fungus Pending  LABS 08/14/14 IgG: 1419 IgA: 147 IgM: 29 ANA: Negative RF: <10 Anti-CCP: <2.0 Urine Blastomyces Ag: Negative Urine Histoplasma Ag: Negative Coccidioides Ab: Negative ANCA Screen: Negative  06/24/14 Quantiferon TB:  Negative [id number]:  Positive CD4:  38    Assessment & Plan:  55 y.o. male with HIV and bilateral lung nodules and left upper lobe bronchiectasis. I discussed the patient's recent CT chest with him today as well as his symptoms suggestive of an underlying infection. I suspect he does indeed have an atypical infection that is contributing to both his cough and wheezing. His spirometry today shows no evidence of airway obstruction or significant bronchodilator response that would indicate underlying asthma. We did discuss bronchoscopy with lavage and transbronchial biopsies as well as risks of the procedure including bleeding, infection, pneumothorax, medication allergy, vocal cord injury, and potentially death. I instructed the patient to contact my office if he had any further questions or  concerns.  1. Wheezing: Holding on steroid inhalers at this time. Prescribed albuterol inhaler to use as needed. 2. Bilateral Lung Nodules & LUL Focal Bronchiectasis: Patient to discuss bronchoscopy procedure and timing with his wife and then contact my office with dates which would be convenient for the procedure. Taking serum CBC & BMP today. 3. Follow-up: Patient to return to clinic in 4 weeks or sooner if needed.  Sonia Baller Ashok Cordia, M.D. [facility] Pager:  [phone] After 3pm or if no response, call [phone] 3:28 PM 09/22/15

## 2015-10-02 NOTE — Op Note (Signed)
Video Bronchoscopy Procedure Note  Pre-Procedure Diagnoses: 1.  Multiple Lung Nodules  Procedures Performed: 1. Bronchoscopy with Airway Inspection 2. Transbronchial Biopsy with Fluroscopy 3. Bronchoalveolar Lavage  Consent:  Informed consent was obtained from the patient after discussing the risks and benefits of the procedure including bleeding, infection, pneumothorax, medication allergy, vocal chord injury, and potentially death.  Conscious Sedation:   Time of First Medication Administration:  2:46 PM Time of Last Medication Administration:  3:06 PM  Time Physicial Left Room: 3:12 PM  Medications Administered During Conscious Sedation: 1. Lidocaine 1% gargle 10cc 2. Lidocaine 1% 14cc via bronchoscope 3. Versed 3mg  IV 4. Fentanyl 100mcg IV  Pre-Procedure Physical Exam: General:  No acute distress. Awake. Alert. ASA Class 2. HEENT:  Moist mucus membranes. No oral ulcers. Mallampati Class 3. Cardiovascular:  Regular rate. No edema. No appreciable JVD. Pulmonary:  Clear to auscultation with good aeration bilaterally. Normal work of breathing. Abdomen:  Soft. Nontender. Nondistended. Normal bowel sounds. Musculoskeletal:  Normal bulk and tone. Normal neck flexion & extension. Neurological:  Oriented to person, place, and time. Moving all 4 extremities equally.  Description of Procedure: Patient was brought back to the endoscopy procedure room.  A time out was performed to identify the correct patient and procedure.  Lidocaine gargle was performed.  Patient was laid recumbent and conscious sedation was administered by respiratory therapist under my direction.  Bite block was inserted and towel was placed over the patient's eyes.  Flexible bronchoscope was then inserted into the posterior pharynx until vocal chords were in full view. There was no abnormality of the vocal chords or arytenoids.  A total of 8cc of Lidocaine was used to anesthetize the vocal chords.  The bronchoscope was  then inserted between the vocal chords with ease into the proximal trachea.  Lidocaine was then used to anesthetize the patient's proximal airways.  An airway inspeciton was performed finding a moderate amount of thin, clear secretions bilaterally.  There were no endobronchial lesions. The patient's endobronchial mucosa appeared normal. The bronchoscope was then advanced into the inferior lingula. Transbronchial biopsies were performed under fluroscopy in the inferior lingula. The first 3 specimens were sent for cultures and the last 2 specimens were sent for pathology.  No immediate pneumothorax was identified with fluroscopy.  A lavage was performed in the same segment of the lingula by instilling a total of 180 cc of sterile saline and aspirating a total of 80cc of blood-tinged fluid.  The flexible bronchoscope was then repositioned to the superior segment of the left lower lobe. A second lavage was performed instilling a total of 120 mL of sterile saline and aspirating a total of 30 mL of clear fluid. The flexible bronchoscope was then removed from the patient's airways after suctioning of the remaining secretions. The bite block was removed and the patient was returned to the upright position.  Blood Loss:  Less than 10cc.  Complications:  None.  Bronchoalveolar Lavage: 1. Performed on the Inferior Segment of the Lingula:  Sent for cytology, cell count, Aspergillus Antigen, PCP DFA, Fungal smear & culture, AFB smear & culture, and routine culture.  2. Performed on the Superior Segment Left Lower Lobe:  Sent for cell count, Fungal smear & culture, AFB smear & culture, and routine culture.  Transbronchial Biopsy: 1. Performed on the Inferior Segment of the Lingula:  Sent for pathology, AFB smear with culture, and fungal smear with culture.  Post Procedure Stat Portable CXR:  Ordered and pending.  Post Procedure  Instructions: Personally spoke with the patient's wife relaying the preliminary  results of the procedure.  Instructed her that the patient is not to operate a car for 24 hours.  The patient should seek immediate medical attention if there is any persistent or progressive hemoptysis, difficulty breathing, or chest pain/discomfort.  The patient should also notify me immediately or seek medical attention for any purulent sputum production or fever occuring today or in the coming days.  The patient will be contacted by me once the final results of the studies are available.  The patient should call my office if they have any questions.

## 2015-10-02 NOTE — Interval H&P Note (Signed)
History and Physical Interval Note:  10/02/2015 1:46 PM  Jordan Holmes  has presented today for surgery, with the diagnosis of lung nodules.  The various methods of treatment have been discussed with the patient and family. After consideration of risks, benefits and other options for treatment, the patient has consented to  Procedure(s): VIDEO BRONCHOSCOPY WITH FLUORO (Bilateral) as a surgical intervention .  The patient's history has been reviewed, patient examined, no change in status, stable for surgery.  I have reviewed the patient's chart and labs.  Questions were answered to the patient's satisfaction.     Donna ChristenJennings E. Jamison NeighborNestor, M.D. [facility] Pager:  [phone] After 3pm or if no response, call [phone] 1:47 PM 10/02/15

## 2015-10-02 NOTE — Discharge Instructions (Signed)
Flexible Bronchoscopy, Care After These instructions give you information on caring for yourself after your procedure. Your doctor may also give you more specific instructions. Call your doctor if you have any problems or questions after your procedure. HOME CARE  Do not eat or drink anything for 2 hours after your procedure. If you try to eat or drink before the medicine wears off, food or drink could go into your lungs. You could also burn yourself.  After 2 hours have passed and when you can cough and gag normally, you may eat soft food and drink liquids slowly.  The day after the test, you may eat your normal diet.  You may do your normal activities.  Keep all doctor visits. GET HELP RIGHT AWAY IF:  You get more and more short of breath.  You get light-headed.  You feel like you are going to pass out (faint).  You have chest pain.  You have new problems that worry you.  You cough up more than a little blood.  You cough up more blood than before. MAKE SURE YOU:  Understand these instructions.  Will watch your condition.  Will get help right away if you are not doing well or get worse.    Do not eat or drink anything until on 10/02/2015. This information is not intended to replace advice given to you by your health care provider. Make sure you discuss any questions you have with your health care provider.   Document Released: 10/17/2008 Document Revised: 12/25/2012 Document Reviewed: 08/24/2012 Elsevier Interactive Patient Education [employer]2016 Elsevier Inc.

## 2015-10-02 NOTE — Progress Notes (Signed)
Video bronchoscopy performed.  Intervention bronchial biopsy. Intervention bronchial washing.  No complications noted.  Will continue to monitor. 

## 2015-10-05 LAB — ACID FAST SMEAR (AFB)
ACID FAST SMEAR - AFSCU2: NEGATIVE
ACID FAST SMEAR - AFSCU2: NEGATIVE

## 2015-10-05 LAB — ACID FAST SMEAR (AFB, MYCOBACTERIA): Acid Fast Smear: NEGATIVE

## 2015-10-05 LAB — PNEUMOCYSTIS JIROVECI SMEAR BY DFA: Pneumocystis jiroveci Ag: NEGATIVE

## 2015-10-06 ENCOUNTER — Telehealth: Payer: Self-pay | Admitting: Pulmonary Disease

## 2015-10-06 NOTE — Telephone Encounter (Signed)
His testing thus far has suggested a possible fungus as a cause for his cough but I am waiting on more test results. No new recommendations. The blood content on his mucus should be minimal to nonexistent. Does this seem to be increasing?

## 2015-10-06 NOTE — Telephone Encounter (Signed)
Spoke with pt and he is refusing to come in and be seen. Pt states that JN advised him that we would contact him for OV once all results were reviewed. Pt states that his symptoms are unchanged from pre-bronch. Pt c/o occasional low-grade fever, cough and rare hemoptysis since procedure. Pt would like to know if you have any treatment recommendations yet. Pt was offered OV multiple times and refused.   JN - Please advise. Thanks!

## 2015-10-06 NOTE — Telephone Encounter (Signed)
Spoke with the pt and notified of recs per JN  He is not producing any bloody sputum at all at this point  Will await results and call as needed

## 2015-10-07 LAB — AEROBIC/ANAEROBIC CULTURE (SURGICAL/DEEP WOUND)

## 2015-10-07 LAB — AEROBIC/ANAEROBIC CULTURE W GRAM STAIN (SURGICAL/DEEP WOUND)

## 2015-10-08 ENCOUNTER — Encounter: Payer: Self-pay | Admitting: Pulmonary Disease

## 2015-10-12 ENCOUNTER — Encounter: Payer: Self-pay | Admitting: Pulmonary Disease

## 2015-10-15 LAB — ACID-FAST (MYCOBACTERIA) SMEAR AND CULTURE WITH REFLEX TO IDENTIFICATION
Acid Fast Culture: NEGATIVE
Acid Fast Smear: NEGATIVE

## 2015-10-16 LAB — FUNGUS CULTURE W SMEAR

## 2015-10-23 ENCOUNTER — Telehealth: Payer: Self-pay | Admitting: Pulmonary Disease

## 2015-10-23 DIAGNOSIS — Z23 Encounter for immunization: Secondary | ICD-10-CM | POA: Diagnosis not present

## 2015-10-23 NOTE — Telephone Encounter (Signed)
Spoke with the pt  He is calling for results of labs and bronch  Please advise thanks!

## 2015-10-27 NOTE — Telephone Encounter (Signed)
Thus far his cultures haven't grown anything of concern. I'm waiting for finalization of the Fungus & AFB Cultures though.

## 2015-10-27 NOTE — Telephone Encounter (Signed)
JN please advise. thanks 

## 2015-10-27 NOTE — Telephone Encounter (Signed)
Pt is aware of JN's response. Nothing further was needed at this time.

## 2015-11-03 LAB — FUNGAL ORGANISM REFLEX

## 2015-11-03 LAB — FUNGUS CULTURE WITH STAIN

## 2015-11-03 LAB — FUNGUS CULTURE RESULT

## 2015-11-04 ENCOUNTER — Telehealth: Payer: Self-pay | Admitting: Pulmonary Disease

## 2015-11-04 NOTE — Telephone Encounter (Signed)
Pt is requesting his sputum culture final results.  JN - please advise. Thanks.

## 2015-11-05 ENCOUNTER — Telehealth: Payer: Self-pay | Admitting: Pulmonary Disease

## 2015-11-05 NOTE — Telephone Encounter (Signed)
There are 2 messages regarding pt's bronch results Per the 11.1.17 phone note:  November 05, 2015  Boone MasterJessica Jones 3:23 PM   There are 2 open messages regarding pt calling for bronch results Will close this message and document in the 11.2.17 message     3:00 PM Chantel C Bowne routed this conversation to [facility] Triage Pool  Ludwig ClarksChantel C Bowne  2:58 PM   [phone] pt calling back      LMOM TCB x1to inform pt/spouse that message has been routed to [facility]JN for results

## 2015-11-05 NOTE — Telephone Encounter (Signed)
[phone] pt calling back

## 2015-11-05 NOTE — Telephone Encounter (Signed)
There are 2 open messages regarding pt calling for bronch results Will close this message and document in the 11.2.17 message

## 2015-11-05 NOTE — Telephone Encounter (Signed)
LMTCB   In the meantime, will forward msg to JN so he is aware pt is calling for results  Here are the results:  FINAL DIAGNOSIS Diagnosis Lung, biopsy, Inferior lingula - SCANT MINIMALLY INFLAMED LUNG PARENCHYMA WITH ANTHRACOSIS. - THERE IS NO EVIDENCE OF MALIGNANCY. Pecola LeisureJOSHUA KISH MD Pathologist, Electronic Signature (Case signed 10/05/2015) Specimen Gross and Clinical Information Specimen(s) Obtained: Lung, biopsy, Inferior lingula Specimen Clinical Information HIV with lung nodules and bronchiectasis (tl) Gross Received in formalin are two minute tan brown tissue fragments ranging from 0.1 to 0.2 cm which are submitted in toto. (KF:gt, 10/05/15) Report signed out from the following location(s) Technical component and interpretation was performed at [facility] [address], [place], [place] [address]. CLIA #: [id number],

## 2015-11-06 ENCOUNTER — Telehealth: Payer: Self-pay | Admitting: Pulmonary Disease

## 2015-11-06 NOTE — Telephone Encounter (Signed)
Called and spoke with pt and he is aware of results. He had a few more questions and he is aware that we will call him once we have these results back .

## 2015-11-06 NOTE — Telephone Encounter (Signed)
Patient called back, requesting results. Patient contact number [phone].Jordan Holmes.Charm RingsErica R Taylor

## 2015-11-06 NOTE — Telephone Encounter (Signed)
Nothing has grown yet. The AFB will not finalize for a couple of weeks yet.

## 2015-11-06 NOTE — Telephone Encounter (Signed)
Called and lmom to make the pt aware that the culture has not grown yet.  The AFB will not be back for another couple of weeks and that we will call with those results.

## 2015-11-10 LAB — FUNGUS CULTURE WITH STAIN

## 2015-11-17 ENCOUNTER — Telehealth: Payer: Self-pay | Admitting: Pulmonary Disease

## 2015-11-17 LAB — ACID FAST CULTURE WITH REFLEXED SENSITIVITIES (MYCOBACTERIA)
Acid Fast Culture: NEGATIVE
Acid Fast Culture: NEGATIVE

## 2015-11-17 LAB — ACID FAST CULTURE WITH REFLEXED SENSITIVITIES: ACID FAST CULTURE - AFSCU3: NEGATIVE

## 2015-11-17 NOTE — Telephone Encounter (Signed)
Please let the patient know that his cultures finalized today and have grown nothing. If his cough and wheezing are still ongoing please schedule him an appointment to see me to discuss our next steps. Thanks.

## 2015-11-17 NOTE — Telephone Encounter (Signed)
Spoke with pt and advised that we will send message to Dr Jamison NeighborNestor to see if he can advise if any further results have come back. Pt verbalized understanding.

## 2015-11-18 NOTE — Telephone Encounter (Signed)
Attempted to contact pt. No answer, no option to leave a message. Will try back.  

## 2015-11-18 NOTE — Telephone Encounter (Signed)
Pt is aware of results; states he has slight wheezing but cough is better. Pt is requesting to speak directly with JN prior to making appointment. Pt is aware I will send message to JN.

## 2015-11-18 NOTE — Telephone Encounter (Signed)
[phone] pt calling back °

## 2015-11-19 NOTE — Telephone Encounter (Signed)
I personally spoke with the patient. He informed me that he is still having some mild wheezing but denies any cough, fever, chills, or sweats. He has no dyspnea but hasn't returned to his previous exercise regimen. He again mentions that he had no wheezing before traveling to [place]. We will plan on the following:  1. Flutter Valve/Acapella - Do 3 breaths twice daily 2. He will notify me if his wheezing does not improve 3. We will consider a methacholine challenge test if the wheezing persists 4. He will notify us for any new infectious symptoms 5. I will plan on seeing him back in 6 months or sooner depending on his symptoms & wheezing

## 2015-12-24 ENCOUNTER — Telehealth: Payer: Self-pay | Admitting: Pulmonary Disease

## 2015-12-24 DIAGNOSIS — D849 Immunodeficiency, unspecified: Secondary | ICD-10-CM

## 2015-12-24 DIAGNOSIS — D899 Disorder involving the immune mechanism, unspecified: Principal | ICD-10-CM

## 2015-12-24 NOTE — Telephone Encounter (Signed)
Called and spoke to pt. Pt states he is still having persistent wheezing and would like to speak with Dr. Jamison NeighborNestor personally. Pt did not go into much detail. Pt states he can be reached at [phone] at any time.   Dr. Jamison NeighborNestor please advise. Thanks.

## 2015-12-25 MED ORDER — FLUTTER DEVI: 0 refills | Status: DC

## 2015-12-25 NOTE — Telephone Encounter (Signed)
Called and spoke with pt and he is to come by the office to pick up the flutter device and specimen cups.  These are in triage as the pt will need to be shown how to use the flutter and will need to sign the form. I have scheduled the pt for his 3 month follow up.

## 2015-12-25 NOTE — Telephone Encounter (Signed)
He reports his wheezing has continued. It doesn't seem to be worse. He reports for the last 2 days he has noticed a "dark, muddy" color to his phlegm. No fever, chills, or sweats. He reports this doesn't seem to have happened today. He denies any frank hemoptysis. He reports it kinds of looks like "old blood". I will have out staff put in for the following:  1. Please put in a sputum culture for AFB, Fungus, & Culture and list his status as Immunosuppressed. This should be put in so he can submit it to the hospital lab since our lab will be closed. 2. He reports he did not pick up or receive an Acapella/Flutter Valve. We will arrange for this and instruct him to blow into the device 3 times in a row twice daily to help with airway clearance.  3. We will also ensure he has an appointment scheduled for 3 months.  Donna ChristenJennings E. Jamison NeighborNestor, M.D. [facility] Pager:  [phone] After 3pm or if no response, call [phone] 12:31 PM 12/25/15

## 2016-01-05 NOTE — Telephone Encounter (Signed)
Spoke with the pt and reminded him to pick up sputum cups and flutter valve  He states he will come this wk, but not sure of what day  Will continue to hold

## 2016-01-11 ENCOUNTER — Telehealth: Payer: Self-pay

## 2016-01-11 MED ORDER — FLUTTER DEVI: 1.0000 | Freq: Every day | 0 refills | Status: DC

## 2016-01-11 NOTE — Telephone Encounter (Signed)
Pt came by our office to pick yo flutter valve and sputum cups. Pt has been shown how to use flutter valve. Pt voiced his understanding and had no further questions. Rx has been placed in JN look out to be signed, along with [facility] once signed.  Nothing further needed.   Will route to [place] to make her aware.

## 2016-03-11 ENCOUNTER — Ambulatory Visit: Payer: BLUE CROSS/BLUE SHIELD | Admitting: Pulmonary Disease

## 2016-03-30 ENCOUNTER — Encounter: Payer: Self-pay | Admitting: Endocrinology

## 2016-03-30 ENCOUNTER — Ambulatory Visit (INDEPENDENT_AMBULATORY_CARE_PROVIDER_SITE_OTHER): Payer: BLUE CROSS/BLUE SHIELD | Admitting: Endocrinology

## 2016-03-30 VITALS — BP 122/82 | HR 97 | Ht 65.0 in | Wt 148.0 lb

## 2016-03-30 DIAGNOSIS — E119 Type 2 diabetes mellitus without complications: Secondary | ICD-10-CM | POA: Diagnosis not present

## 2016-03-30 LAB — POCT GLYCOSYLATED HEMOGLOBIN (HGB A1C): HEMOGLOBIN A1C: 7.1

## 2016-03-30 MED ORDER — METFORMIN HCL ER 500 MG PO TB24: 2000.0000 mg | ORAL_TABLET | Freq: Every day | ORAL | 3 refills | Status: DC

## 2016-03-30 MED ORDER — ATORVASTATIN CALCIUM 10 MG PO TABS: 10.0000 mg | ORAL_TABLET | Freq: Every day | ORAL | 3 refills | Status: DC

## 2016-03-30 MED ORDER — SITAGLIPTIN PHOSPHATE 50 MG PO TABS: 50.0000 mg | ORAL_TABLET | Freq: Every day | ORAL | 3 refills | Status: DC

## 2016-03-30 MED ORDER — GLUCOSE BLOOD VI STRP: 1.0000 | ORAL_STRIP | Freq: Every day | 3 refills | Status: DC

## 2016-03-30 NOTE — Patient Instructions (Addendum)
good diet and exercise significantly improve the control of your diabetes.  please let me know if you wish to be referred to a dietician.  high blood sugar is very risky to your health.  you should see an eye doctor and dentist every year.  It is very important to get all recommended vaccinations.  Controlling your blood pressure and cholesterol drastically reduces the damage diabetes does to your body.  Those who smoke should quit.  Please discuss these with your doctor.  check your blood sugar once a day.  vary the time of day when you check, between before the 3 meals, and at bedtime.  also check if you have symptoms of your blood sugar being too high or too low.  please keep a record of the readings and bring it to your next appointment here (or you can bring the meter itself).  You can write it on any piece of paper.  please call us sooner if your blood sugar goes below 70, or if you have a lot of readings over 200. I have sent a prescription to your pharmacy, to add "januvia."   Please come back for a follow-up appointment in 2 months.  

## 2016-03-30 NOTE — Progress Notes (Signed)
Subjective:    Patient ID: Jordan Holmes, male    DOB: 05/19/1960, 56 y.o.   MRN: [id number]  HPI pt is referred by Dr Swazil[place], for diabetes.  Pt states DM was dx'ed in 2010; he has mild if any neuropathy of the lower extremities; he is unaware of any associated chronic complications; he has never been on insulin; pt says his diet and exercise are good; he has never had pancreatitis, pancreatic surgery, severe hypoglycemia or DKA.  Meter is downloaded today, and the printout is scanned into the record.  All are fasting, and in the mid-100's. He denies hypoglycemia.  Past Medical History:  Diagnosis Date  . Allergy   . Bronchiectasis (HCC)   . Diabetes mellitus   . HIV infection (HCC)   . Hyperlipidemia   . Multiple lung nodules on CT   . Seasonal allergies     Past Surgical History:  Procedure Laterality Date  . VIDEO BRONCHOSCOPY Bilateral 10/02/2015   Procedure: VIDEO BRONCHOSCOPY WITH FLUORO;  Surgeon: Roslynn AmbleJennings E Nestor, MD;  [facility];  Service: Cardiopulmonary;  Laterality: Bilateral;    Social History   Social History  . Marital status: Married    Spouse name: N/A  . Number of children: 2  . Years of education: N/A   Occupational History  . IT    Social History Main Topics  . Smoking status: Never Smoker  . Smokeless tobacco: Never Used     Comment: Second-hand exposure through his father  . Alcohol use No  . Drug use: No  . Sexual activity: Yes    Partners: Female    Birth control/ protection: Condom     Comment: declined condoms   Other Topics Concern  . Not on file   Social History Narrative   Originally from [place]. [place] move to the [place] in 2005. He has also lived in [place], [place] and also [place]. He has traveled all over the [place]. No pets currently. No bird exposure. No mold or hot tub exposure. 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 Enjoys playing tennis & walking.    Current Outpatient Prescriptions on File Prior to Visit  Medication Sig Dispense  Refill  . Cholecalciferol (VITAMIN D3) 3000 units TABS Take 1 tablet by mouth daily.    Marland Kitchen. efavirenz-emtricitabine-tenofovir (ATRIPLA) 600-200-300 MG tablet Take 1 tablet by mouth at bedtime. 90 tablet 3  . Omega-3 Fatty Acids (FISH OIL) 1200 MG CPDR Take 12,000 mg by mouth daily.    Marland Kitchen. Respiratory Therapy Supplies (FLUTTER) DEVI To use as directed 1 each 0  . Respiratory Therapy Supplies (FLUTTER) DEVI 1 Device by Does not apply route daily. 1 each 0   No current facility-administered medications on file prior to visit.     Allergies  Allergen Reactions  . Amoxicillin Nausea And Vomiting    Fever and vomiting Diarrhea & fever    Family History  Problem Relation Age of Onset  . Lung disease Neg Hx   . Rheumatologic disease Neg Hx   . Diabetes Neg Hx    BP 122/82   Pulse 97   Ht 5\' 5"  (1.651 m)   Wt 148 lb (67.1 kg)   SpO2 98%   BMI 24.63 kg/m   Review of Systems denies weight loss, blurry vision, chest pain, n/v, muscle cramps, excessive diaphoresis, depression, rhinorrhea, and easy bruising.  He has dry skin, slight wheezing, cold intolerance, frequent urination, and slight headache.      Objective:   Physical Exam  VS: see vs page GEN: no distress HEAD: head: no deformity eyes: no periorbital swelling, no proptosis external nose and ears are normal mouth: no lesion seen NECK: supple, thyroid is not enlarged CHEST WALL: no deformity LUNGS: clear to auscultation CV: reg rate and rhythm, no murmur ABD: abdomen is soft, nontender.  no hepatosplenomegaly.  not distended.  no hernia MUSCULOSKELETAL: muscle bulk and strength are grossly normal.  no obvious joint swelling.  gait is normal and steady EXTEMITIES: no deformity.  no ulcer on the feet.  feet are of normal color and temp.  no edema.  PULSES: dorsalis pedis intact bilat.  no carotid bruit.  NEURO:  cn 2-12 grossly intact.   readily moves all 4's.  sensation is intact to touch on the feet.  SKIN:  Normal texture and  temperature.  No rash or suspicious lesion is visible.   NODES:  None palpable at the neck.   PSYCH: alert, well-oriented.  Does not appear anxious nor depressed.    a1c=7.1%  I have reviewed outside records, and summarized: Pt was noted to have well-controlled type 2 DM.  Med adherence was noted to be good.  Other med probs were stable     Assessment & Plan:  Type 2 DM, new to me: he needs increased rx, if it can be done with a regimen that avoids or minimizes hypoglycemia.   Hypertriglyceridemia: we can ad pioglitizone if necessary  Patient Instructions  good diet and exercise significantly improve the control of your diabetes.  please let me know if you wish to be referred to a dietician.  high blood sugar is very risky to your health.  you should see an eye doctor and dentist every year.  It is very important to get all recommended vaccinations.  Controlling your blood pressure and cholesterol drastically reduces the damage diabetes does to your body.  Those who smoke should quit.  Please discuss these with your doctor.  check your blood sugar once a day.  vary the time of day when you check, between before the 3 meals, and at bedtime.  also check if you have symptoms of your blood sugar being too high or too low.  please keep a record of the readings and bring it to your next appointment here (or you can bring the meter itself).  You can write it on any piece of paper.  please call us sooner if your blood sugar goes below 70, or if you have a lot of readings over 200. I have sent a prescription to your pharmacy, to add "[place]." Please come back for a follow-up appointment in 2 months.

## 2016-04-04 ENCOUNTER — Other Ambulatory Visit: Payer: Self-pay

## 2016-04-07 ENCOUNTER — Encounter: Payer: Self-pay | Admitting: Internal Medicine

## 2016-04-07 ENCOUNTER — Ambulatory Visit (INDEPENDENT_AMBULATORY_CARE_PROVIDER_SITE_OTHER): Payer: BLUE CROSS/BLUE SHIELD | Admitting: Internal Medicine

## 2016-04-07 DIAGNOSIS — F320 Major depressive disorder, single episode, mild: Secondary | ICD-10-CM

## 2016-04-07 DIAGNOSIS — B20 Human immunodeficiency virus [HIV] disease: Secondary | ICD-10-CM | POA: Diagnosis not present

## 2016-04-07 DIAGNOSIS — N4889 Other specified disorders of penis: Secondary | ICD-10-CM | POA: Insufficient documentation

## 2016-04-07 DIAGNOSIS — K5900 Constipation, unspecified: Secondary | ICD-10-CM

## 2016-04-07 DIAGNOSIS — F329 Major depressive disorder, single episode, unspecified: Secondary | ICD-10-CM | POA: Insufficient documentation

## 2016-04-07 DIAGNOSIS — F32A Depression, unspecified: Secondary | ICD-10-CM | POA: Insufficient documentation

## 2016-04-07 LAB — CBC
HCT: 41.9 % (ref 38.5–50.0)
Hemoglobin: 13.1 g/dL — ABNORMAL LOW (ref 13.2–17.1)
MCH: 22.5 pg — ABNORMAL LOW (ref 27.0–33.0)
MCHC: 31.3 g/dL — AB (ref 32.0–36.0)
MCV: 72 fL — ABNORMAL LOW (ref 80.0–100.0)
MPV: 9.6 fL (ref 7.5–12.5)
PLATELETS: 306 10*3/uL (ref 140–400)
RBC: 5.82 MIL/uL — ABNORMAL HIGH (ref 4.20–5.80)
RDW: 17.9 % — AB (ref 11.0–15.0)
WBC: 5.7 10*3/uL (ref 3.8–10.8)

## 2016-04-07 LAB — LIPID PANEL
Cholesterol: 153 mg/dL (ref <200)
HDL: 38 mg/dL — ABNORMAL LOW (ref >40)
LDL CALC: 69 mg/dL (ref <100)
TRIGLYCERIDES: 229 mg/dL — AB (ref <150)
Total CHOL/HDL Ratio: 4 Ratio (ref <5.0)
VLDL: 46 mg/dL — ABNORMAL HIGH (ref <30)

## 2016-04-07 LAB — COMPREHENSIVE METABOLIC PANEL
ALK PHOS: 84 U/L (ref 40–115)
ALT: 16 U/L (ref 9–46)
AST: 19 U/L (ref 10–35)
Albumin: 4.4 g/dL (ref 3.6–5.1)
BILIRUBIN TOTAL: 0.3 mg/dL (ref 0.2–1.2)
BUN: 11 mg/dL (ref 7–25)
CALCIUM: 9.1 mg/dL (ref 8.6–10.3)
CO2: 25 mmol/L (ref 20–31)
CREATININE: 1.19 mg/dL (ref 0.70–1.33)
Chloride: 102 mmol/L (ref 98–110)
GLUCOSE: 129 mg/dL — AB (ref 65–99)
Potassium: 4.2 mmol/L (ref 3.5–5.3)
SODIUM: 136 mmol/L (ref 135–146)
Total Protein: 7.7 g/dL (ref 6.1–8.1)

## 2016-04-07 MED ORDER — BICTEGRAVIR-EMTRICITAB-TENOFOV 50-200-25 MG PO TABS: 1.0000 | ORAL_TABLET | Freq: Every day | ORAL | 3 refills | Status: DC

## 2016-04-07 NOTE — Assessment & Plan Note (Signed)
I discussed over-the-counter options to help him deal with his constipation and hemorrhoid

## 2016-04-07 NOTE — Progress Notes (Signed)
HPI: Jordan Holmes is a 56 y.o. male who is here for his HIV follow up visit.   Allergies: Allergies  Allergen Reactions  . Amoxicillin Nausea And Vomiting    Fever and vomiting Diarrhea & fever    Vitals: Temp: 97.5 F (36.4 C) (04/05 1113) Temp Source: Oral (04/05 1113) BP: 110/75 (04/05 1113) Pulse Rate: 75 (04/05 1113)  Past Medical History: Past Medical History:  Diagnosis Date  . Allergy   . Bronchiectasis (HCC)   . Diabetes mellitus   . HIV infection (HCC)   . Hyperlipidemia   . Multiple lung nodules on CT   . Seasonal allergies     Social History: Social History   Social History  . Marital status: Married    Spouse name: N/A  . Number of children: 2  . Years of education: N/A   Occupational History  . IT    Social History Main Topics  . Smoking status: Never Smoker  . Smokeless tobacco: Never Used     Comment: Second-hand exposure through his father  . Alcohol use No  . Drug use: No  . Sexual activity: Yes    Partners: Female    Birth control/ protection: Condom     Comment: declined condoms   Other Topics Concern  . None   Social History Narrative   Originally from [place]. He move to the [place] in 2005. He has also lived in [place], [place], and also [place]. He has traveled all over the [place]. No pets currently. No bird exposure. No mold or hot tub exposure. He currently works in Consulting civil engineer. Enjoys playing tennis & walking.    Previous Regimen: None  Current Regimen: ATP  Labs: HIV 1 RNA Quant (copies/mL)  Date Value  06/24/2014 <20   CD4 T Cell Abs (/uL)  Date Value  06/24/2014 450   HCV Ab (no units)  Date Value  06/24/2014 NEGATIVE    CrCl: CrCl cannot be calculated (Patient's most recent lab result is older than the maximum 21 days allowed.).  Lipids:    Component Value Date/Time   CHOL 137 05/15/2014    Assessment: Jordan Holmes is one of those people that is very hesitant to change. He has been super compliant with his ART. I remember  meeting him once before to try to convince him to change to [facility] but he did not wanted to. On top of his HIV, he also has multiple co-morbidities including DM. I tried to explained the benefit of the new ART to him today in [place]. After a good 2 minutes, he agreed to change therapy for the sake of safety (renal, bone, DM, etc). Had to sent the rx to [facility]. I'll check with them tomorrow.  Recommendations:  Stop ATP Start Biktarvy 1 PO qday Gave him the copay info   Ulyses Southward, PharmD, BCPS, AAHIVP, CPP Clinical Infectious Disease Pharmacist [facility] 04/07/2016, 4:15 PM

## 2016-04-07 NOTE — Assessment & Plan Note (Signed)
I did not find any apparent cause for his penile discomfort.

## 2016-04-07 NOTE — Assessment & Plan Note (Signed)
I will have to check labs today to make sure that he is still under excellent control. His adherence is excellent so I doubt there will be a problem. He is now ready to change to a new, safer preparation. I reviewed options with him and also had him speak with our ID pharmacist, Ulyses Southward. We will switch him to [place].  This will not have any drug drug interaction with his metformin. He would like to return for repeat lab work in 6 weeks just to make sure it is working.

## 2016-04-07 NOTE — Assessment & Plan Note (Signed)
He was given a referral to see our behavioral health counselor, Franne Forts.

## 2016-04-07 NOTE — Progress Notes (Signed)
Patient Active Problem List   Diagnosis Date Noted  . HIV disease (HCC) 07/20/2014    Priority: High  . Constipation 04/07/2016  . Penile pain 04/07/2016  . Depression 04/07/2016  . Lung nodules   . Multiple lung nodules on CT 08/14/2014  . Bronchiectasis without acute exacerbation (HCC) 08/14/2014  . Wheezing 08/14/2014  . Seasonal allergies 07/20/2014  . Hypertriglyceridemia 11/16/2010  . Diabetes mellitus, type 2 (HCC) 11/16/2010    Patient's Medications  New Prescriptions   BICTEGRAVIR-EMTRICITABINE-TENOFOVIR AF (BIKTARVY) 50-200-25 MG TABS TABLET    Take 1 tablet by mouth daily.  Previous Medications   ATORVASTATIN (LIPITOR) 10 MG TABLET    Take 1 tablet (10 mg total) by mouth daily.   CHOLECALCIFEROL (VITAMIN D3) 3000 UNITS TABS    Take 1 tablet by mouth daily.   GLUCOSE BLOOD (ONETOUCH VERIO) TEST STRIP    1 each by Other route daily. Use as instructed   METFORMIN (GLUCOPHAGE-XR) 500 MG 24 HR TABLET    Take 4 tablets (2,000 mg total) by mouth daily with breakfast.   OMEGA-3 FATTY ACIDS (FISH OIL) 1200 MG CPDR    Take 12,000 mg by mouth daily.   RESPIRATORY THERAPY SUPPLIES (FLUTTER) DEVI    To use as directed   RESPIRATORY THERAPY SUPPLIES (FLUTTER) DEVI    1 Device by Does not apply route daily.   SITAGLIPTIN (JANUVIA) 50 MG TABLET    Take 1 tablet (50 mg total) by mouth daily.  Modified Medications   No medications on file  Discontinued Medications   EFAVIRENZ-EMTRICITABINE-TENOFOVIR (ATRIPLA) 600-200-300 MG TABLET    Take 1 tablet by mouth at bedtime.    Subjective: Jordan Holmes is in for his first visit since July of last year. He continues to take Atripla at bedtime and has not missed any doses. He will occasionally have vivid dreams but this is not bothersome for him. He is accompanied by his wife today. She states that she tested negative for HIV about 7 years ago but has not been tested since. He has noticed some slight stinging irritation on his foreskin  for the past 2 weeks. He did not have any injury to that area. He has never had that sensation before. He's also had some problems with mild constipation for the past 3 weeks and swollen hemorrhoid. He also states that he has been feeling slightly down and depressed recently.   Review of Systems: Review of Systems  Constitutional: Positive for malaise/fatigue. Negative for chills, diaphoresis, fever and weight loss.  HENT: Negative for sore throat.   Respiratory: Negative for cough, sputum production and shortness of breath.   Cardiovascular: Negative for chest pain.  Gastrointestinal: Positive for constipation. Negative for abdominal pain, diarrhea, heartburn, nausea and vomiting.       Perirectal discomfort.  Genitourinary: Negative for dysuria and frequency.       As noted in history of present illness.  Musculoskeletal: Negative for joint pain and myalgias.  Skin: Negative for rash.  Neurological: Negative for dizziness and headaches.  Psychiatric/Behavioral: Positive for depression. Negative for substance abuse. The patient is not nervous/anxious.     Past Medical History:  Diagnosis Date  . Allergy   . Bronchiectasis (HCC)   . Diabetes mellitus   . HIV infection (HCC)   . Hyperlipidemia   . Multiple lung nodules on CT   . Seasonal allergies     Social History  Substance Use Topics  . Smoking status:  Never Smoker  . Smokeless tobacco: Never Used     Comment: Second-hand exposure through his father  . Alcohol use No    Family History  Problem Relation Age of Onset  . Lung disease Neg Hx   . Rheumatologic disease Neg Hx   . Diabetes Neg Hx     Allergies  Allergen Reactions  . Amoxicillin Nausea And Vomiting    Fever and vomiting Diarrhea & fever    Objective:  Vitals:   04/07/16 1113  BP: 110/75  Pulse: 75  Temp: 97.5 F (36.4 C)  TempSrc: Oral  Weight: 147 lb (66.7 kg)   Body mass index is 24.46 kg/m.  Physical Exam  Constitutional: He is oriented  to person, place, and time.  He has lots of questions. He is in no distress.  HENT:  Mouth/Throat: No oropharyngeal exudate.  Eyes: Conjunctivae are normal.  Cardiovascular: Normal rate and regular rhythm.   No murmur heard. Pulmonary/Chest: Effort normal and breath sounds normal. He has no wheezes. He has no rales.  Abdominal: Soft. He exhibits no mass. There is no tenderness.  Genitourinary: Penis normal.  Genitourinary Comments: I see no lesions in the area of sensitivity of his foreskin.  Musculoskeletal: Normal range of motion.  Neurological: He is alert and oriented to person, place, and time.  Skin: No rash noted.  Psychiatric: Mood and affect normal.    Lab Results Lab Results  Component Value Date   WBC 10.1 09/22/2015   HGB 12.7 (L) 09/22/2015   HCT 38.9 (L) 09/22/2015   MCV 72.7 (L) 09/22/2015   PLT 388.0 09/22/2015    Lab Results  Component Value Date   CREATININE 1.15 09/22/2015   BUN 8 09/22/2015   NA 134 (L) 09/22/2015   K 3.6 09/22/2015   CL 100 09/22/2015   CO2 28 09/22/2015    Lab Results  Component Value Date   ALT 20 05/15/2014   AST 21 05/15/2014   ALKPHOS 82 05/15/2014    Lab Results  Component Value Date   CHOL 137 05/15/2014   HIV 1 RNA Quant (copies/mL)  Date Value  06/24/2014 <20   CD4 T Cell Abs (/uL)  Date Value  06/24/2014 450     Problem List Items Addressed This Visit      High   HIV disease (HCC)    I will have to check labs today to make sure that he is still under excellent control. His adherence is excellent so I doubt there will be a problem. He is now ready to change to a new, safer preparation. I reviewed options with him and also had him speak with our ID pharmacist, Ulyses Southward. We will switch him to [place].  This will not have any drug drug interaction with his metformin. He would like to return for repeat lab work in 6 weeks just to make sure it is working.      Relevant Medications    bictegravir-emtricitabine-tenofovir AF (BIKTARVY) 50-200-25 MG TABS tablet   Other Relevant Orders   T-helper cell (CD4)- ([facility] clinic only)   HIV 1 RNA quant-no reflex-bld   CBC   Comprehensive metabolic panel   RPR   Lipid panel   T-helper cell (CD4)- ([facility] clinic only)   HIV 1 RNA quant-no reflex-bld     Unprioritized   Constipation    I discussed over-the-counter options to help him deal with his constipation and hemorrhoid      Depression    He was  given a referral to see our behavioral health counselor, Franne Forts.      Penile pain    I did not find any apparent cause for his penile discomfort.           Cliffton Asters, MD [facility] for Infectious Disease [facility] [phone] pager   [phone] cell 04/07/2016, 12:27 PM

## 2016-04-07 NOTE — Patient Instructions (Addendum)
Try miralax for your constipation and Preparation H for your hemorrhoid.

## 2016-04-08 LAB — RPR

## 2016-04-08 LAB — T-HELPER CELL (CD4) - (RCID CLINIC ONLY)
CD4 % Helper T Cell: 27 % — ABNORMAL LOW (ref 33–55)
CD4 T CELL ABS: 600 /uL (ref 400–2700)

## 2016-04-11 LAB — HIV-1 RNA QUANT-NO REFLEX-BLD
HIV 1 RNA QUANT: 24 {copies}/mL — AB
HIV-1 RNA QUANT, LOG: 1.38 {Log_copies}/mL — AB

## 2016-04-15 ENCOUNTER — Other Ambulatory Visit: Payer: Self-pay

## 2016-04-15 MED ORDER — ATORVASTATIN CALCIUM 10 MG PO TABS: 10.0000 mg | ORAL_TABLET | Freq: Every day | ORAL | 3 refills | Status: DC

## 2016-04-15 MED ORDER — METFORMIN HCL ER 500 MG PO TB24: 2000.0000 mg | ORAL_TABLET | Freq: Every day | ORAL | 3 refills | Status: DC

## 2016-04-15 MED ORDER — SITAGLIPTIN PHOSPHATE 50 MG PO TABS: 50.0000 mg | ORAL_TABLET | Freq: Every day | ORAL | 3 refills | Status: DC

## 2016-04-18 ENCOUNTER — Telehealth: Payer: Self-pay | Admitting: Endocrinology

## 2016-04-18 NOTE — Telephone Encounter (Signed)
Patient stated the pharmacy haven't medication  metFORMIN (GLUCOPHAGE-XR) 500 MG 24 hr tablet atorvastatin (LIPITOR) 10 MG tablet sitaGLIPtin (JANUVIA) 50 MG tablet  [facility] - [place], [place] - [address] [phone] (Phone) [phone] (Fax)    send to this number [phone]

## 2016-04-19 MED ORDER — ATORVASTATIN CALCIUM 10 MG PO TABS: 10.0000 mg | ORAL_TABLET | Freq: Every day | ORAL | 3 refills | Status: DC

## 2016-04-19 MED ORDER — METFORMIN HCL ER 500 MG PO TB24: 2000.0000 mg | ORAL_TABLET | Freq: Every day | ORAL | 3 refills | Status: DC

## 2016-04-19 MED ORDER — SITAGLIPTIN PHOSPHATE 50 MG PO TABS: 50.0000 mg | ORAL_TABLET | Freq: Every day | ORAL | 3 refills | Status: DC

## 2016-04-19 NOTE — Telephone Encounter (Signed)
I contacted the patient and he advised me the mail service listed [facility]) was not the correct pharmacy. Patient stated we needed to submit lipitor, metformin and januvia to the [facility]. Prescriptions submitted, patient advised to call back if he had any further issues receiving his medications.

## 2016-04-21 DIAGNOSIS — M545 Low back pain: Secondary | ICD-10-CM | POA: Diagnosis not present

## 2016-04-21 DIAGNOSIS — M25562 Pain in left knee: Secondary | ICD-10-CM | POA: Diagnosis not present

## 2016-04-21 DIAGNOSIS — M792 Neuralgia and neuritis, unspecified: Secondary | ICD-10-CM | POA: Diagnosis not present

## 2016-05-05 DIAGNOSIS — E538 Deficiency of other specified B group vitamins: Secondary | ICD-10-CM | POA: Diagnosis not present

## 2016-05-23 DIAGNOSIS — E119 Type 2 diabetes mellitus without complications: Secondary | ICD-10-CM | POA: Diagnosis not present

## 2016-05-23 DIAGNOSIS — Z0000 Encounter for general adult medical examination without abnormal findings: Secondary | ICD-10-CM | POA: Diagnosis not present

## 2016-06-01 ENCOUNTER — Telehealth: Payer: Self-pay

## 2016-06-01 NOTE — Telephone Encounter (Signed)
Appointment for 06/02/2016 cancelled.

## 2016-06-01 NOTE — Telephone Encounter (Signed)
[facility] Nonclinical Telephone Record [facility] Client [facility] - Human resources officerClie Client Site [facility] Contact Type Call Who Is Calling Patient / Member / Family / Caregiver Caller Name Casilda Carlsaidu [place] Caller Phone Number [phone] Patient Name Areatha Keasaidu Massing Call Type Message Only Information Provided [facility] Initial Comment Caller states he needs to cancel his appointment scheduled for 5/31. General office information was given to caller. Call Closed By: Effie BerkshireAmanda Pyle Transaction Date/Time: 05/31/2016 7:40:48 PM (ET)

## 2016-06-02 ENCOUNTER — Ambulatory Visit: Payer: BLUE CROSS/BLUE SHIELD | Admitting: Endocrinology

## 2016-06-07 ENCOUNTER — Ambulatory Visit: Payer: BLUE CROSS/BLUE SHIELD | Admitting: Internal Medicine

## 2016-06-10 DIAGNOSIS — E538 Deficiency of other specified B group vitamins: Secondary | ICD-10-CM | POA: Diagnosis not present

## 2016-06-20 ENCOUNTER — Other Ambulatory Visit: Payer: Self-pay

## 2016-06-20 MED ORDER — GLUCOSE BLOOD VI STRP: 1.0000 | ORAL_STRIP | Freq: Every day | 3 refills | Status: DC

## 2016-06-30 DIAGNOSIS — Z0000 Encounter for general adult medical examination without abnormal findings: Secondary | ICD-10-CM | POA: Diagnosis not present

## 2016-08-01 ENCOUNTER — Ambulatory Visit: Payer: BLUE CROSS/BLUE SHIELD | Admitting: Internal Medicine

## 2016-09-08 ENCOUNTER — Ambulatory Visit: Payer: BLUE CROSS/BLUE SHIELD | Admitting: Internal Medicine

## 2016-09-28 DIAGNOSIS — H2513 Age-related nuclear cataract, bilateral: Secondary | ICD-10-CM | POA: Diagnosis not present

## 2016-09-28 DIAGNOSIS — E119 Type 2 diabetes mellitus without complications: Secondary | ICD-10-CM | POA: Diagnosis not present

## 2016-09-28 DIAGNOSIS — Z7984 Long term (current) use of oral hypoglycemic drugs: Secondary | ICD-10-CM | POA: Diagnosis not present

## 2016-10-04 ENCOUNTER — Ambulatory Visit (INDEPENDENT_AMBULATORY_CARE_PROVIDER_SITE_OTHER): Payer: BLUE CROSS/BLUE SHIELD | Admitting: Internal Medicine

## 2016-10-04 DIAGNOSIS — F330 Major depressive disorder, recurrent, mild: Secondary | ICD-10-CM

## 2016-10-04 DIAGNOSIS — R3989 Other symptoms and signs involving the genitourinary system: Secondary | ICD-10-CM | POA: Diagnosis not present

## 2016-10-04 DIAGNOSIS — B20 Human immunodeficiency virus [HIV] disease: Secondary | ICD-10-CM | POA: Diagnosis not present

## 2016-10-04 NOTE — Assessment & Plan Note (Signed)
His nocturnal omissions may be due to decreased sexual activity with his wife. I'm not absolute certainty but I doubt that it is due to anything serious.

## 2016-10-04 NOTE — Progress Notes (Signed)
Patient Active Problem List   Diagnosis Date Noted  . HIV disease (HCC) 07/20/2014    Priority: High  . Nocturnal emission 10/04/2016  . Constipation 04/07/2016  . Penile pain 04/07/2016  . Depression 04/07/2016  . Lung nodules   . Multiple lung nodules on CT 08/14/2014  . Bronchiectasis without acute exacerbation (HCC) 08/14/2014  . Wheezing 08/14/2014  . Seasonal allergies 07/20/2014  . Hypertriglyceridemia 11/16/2010  . Diabetes mellitus, type 2 (HCC) 11/16/2010    Patient's Medications  New Prescriptions   No medications on file  Previous Medications   ATORVASTATIN (LIPITOR) 10 MG TABLET    Take 1 tablet (10 mg total) by mouth daily.   BICTEGRAVIR-EMTRICITABINE-TENOFOVIR AF (BIKTARVY) 50-200-25 MG TABS TABLET    Take 1 tablet by mouth daily.   CHOLECALCIFEROL (VITAMIN D3) 3000 UNITS TABS    Take 1 tablet by mouth daily.   GLUCOSE BLOOD (ONETOUCH VERIO) TEST STRIP    1 each by Other route daily. Use as instructed   METFORMIN (GLUCOPHAGE-XR) 500 MG 24 HR TABLET    Take 4 tablets (2,000 mg total) by mouth daily with breakfast.   OMEGA-3 FATTY ACIDS (FISH OIL) 1200 MG CPDR    Take 12,000 mg by mouth daily.   RESPIRATORY THERAPY SUPPLIES (FLUTTER) DEVI    To use as directed   RESPIRATORY THERAPY SUPPLIES (FLUTTER) DEVI    1 Device by Does not apply route daily.   SITAGLIPTIN (JANUVIA) 50 MG TABLET    Take 1 tablet (50 mg total) by mouth daily.  Modified Medications   No medications on file  Discontinued Medications   No medications on file    Subjective: Jordan Holmes is in for his routine HIV follow-up visit. At the time of his last visit I changed Atripla to Jordan Holmes but he has continued to take leftover supply of Atripla. He says he has about 3 more pills left. He is already filled his Biktarvy prescription and was able to get it without any co-pay. He says that he still has days when he feels slightly down and depressed but overall is doing better. He and his wife have  not been sexually active recently and he's noticed over the past few months that he has had a few episodes of nocturnal sperm emissions.  Review of Systems: Review of Systems  Constitutional: Negative for chills, diaphoresis, fever, malaise/fatigue and weight loss.  HENT: Negative for sore throat.   Respiratory: Negative for cough, sputum production and shortness of breath.   Cardiovascular: Negative for chest pain.  Gastrointestinal: Negative for abdominal pain, diarrhea, heartburn, nausea and vomiting.  Genitourinary: Negative for dysuria and frequency.  Musculoskeletal: Negative for joint pain and myalgias.  Skin: Negative for rash.  Neurological: Negative for dizziness and headaches.  Psychiatric/Behavioral: Positive for depression. Negative for substance abuse. The patient is not nervous/anxious.     Past Medical History:  Diagnosis Date  . Allergy   . Bronchiectasis (HCC)   . Diabetes mellitus   . HIV infection (HCC)   . Hyperlipidemia   . Multiple lung nodules on CT   . Seasonal allergies     Social History  Substance Use Topics  . Smoking status: Never Smoker  . Smokeless tobacco: Never Used     Comment: Second-hand exposure through his father  . Alcohol use No    Family History  Problem Relation Age of Onset  . Lung disease Neg Hx   . Rheumatologic disease Neg  Hx   . Diabetes Neg Hx     Allergies  Allergen Reactions  . Amoxicillin Nausea And Vomiting    Fever and vomiting Diarrhea & fever    Objective:  Vitals:   10/04/16 1605  BP: 105/74  Pulse: 92  Temp: 98.2 F (36.8 C)  TempSrc: Oral  Weight: 150 lb (68 kg)   Body mass index is 24.96 kg/m.  Physical Exam  Constitutional: He is oriented to person, place, and time.  He is talkative and in good spirits.  HENT:  Mouth/Throat: No oropharyngeal exudate.  Eyes: Conjunctivae are normal.  Cardiovascular: Normal rate and regular rhythm.   No murmur heard. Pulmonary/Chest: Effort normal and  breath sounds normal.  Abdominal: Soft. He exhibits no mass. There is no tenderness.  Musculoskeletal: Normal range of motion.  Neurological: He is alert and oriented to person, place, and time.  Skin: No rash noted.  Psychiatric: Mood and affect normal.    Lab Results Lab Results  Component Value Date   WBC 5.7 04/07/2016   HGB 13.1 (L) 04/07/2016   HCT 41.9 04/07/2016   MCV 72.0 (L) 04/07/2016   PLT 306 04/07/2016    Lab Results  Component Value Date   CREATININE 1.19 04/07/2016   BUN 11 04/07/2016   NA 136 04/07/2016   K 4.2 04/07/2016   CL 102 04/07/2016   CO2 25 04/07/2016    Lab Results  Component Value Date   ALT 16 04/07/2016   AST 19 04/07/2016   ALKPHOS 84 04/07/2016   BILITOT 0.3 04/07/2016    Lab Results  Component Value Date   CHOL 153 04/07/2016   HDL 38 (L) 04/07/2016   LDLCALC 69 04/07/2016   TRIG 229 (H) 04/07/2016   CHOLHDL 4.0 04/07/2016   Lab Results  Component Value Date   LABRPR NON REAC 04/07/2016   HIV 1 RNA Quant (copies/mL)  Date Value  04/07/2016 24 (H)  06/24/2014 <20   CD4 T Cell Abs (/uL)  Date Value  04/07/2016 600  06/24/2014 450     Problem List Items Addressed This Visit      High   HIV disease (HCC)    His infection has been under good long-term control. He will switch over to Jordan Holmes now and follow-up after lab work in 6 months      Relevant Orders   T-helper cell (CD4)- ([facility] clinic only)   HIV 1 RNA quant-no reflex-bld   CBC   Comprehensive metabolic panel   Lipid panel   RPR     Unprioritized   Depression    He has very mild but improved depression. I told him that he may note further improvement when he stops taking Atripla.      Nocturnal emission    His nocturnal omissions may be due to decreased sexual activity with his wife. I'm not absolute certainty but I doubt that it is due to anything serious.           Jordan Asters, MD [facility] for Infectious Disease [facility] [phone] pager   [phone] cell 10/04/2016, 5:08 PM

## 2016-10-04 NOTE — Assessment & Plan Note (Signed)
His infection has been under good long-term control. He will switch over to [place] now and follow-up after lab work in 6 months

## 2016-10-04 NOTE — Assessment & Plan Note (Signed)
He has very mild but improved depression. I told him that he may note further improvement when he stops taking Atripla.

## 2016-10-14 DIAGNOSIS — Z23 Encounter for immunization: Secondary | ICD-10-CM | POA: Diagnosis not present

## 2016-10-28 ENCOUNTER — Telehealth: Payer: Self-pay | Admitting: Endocrinology

## 2016-10-28 NOTE — Telephone Encounter (Signed)
Do you mean a1c.  We can do that here.  However, if wants to have at [facility], that is fine with me.

## 2016-10-28 NOTE — Telephone Encounter (Signed)
Would you like me to order & fax labs for patient or wait until his actual visit?

## 2016-10-28 NOTE — Telephone Encounter (Signed)
Pt is wanting to go to [facility] for labs prior to his visit on 12/7. Fax # [phone]

## 2016-10-31 NOTE — Telephone Encounter (Signed)
I called patient & he stated that he did want to have A1C done at [facility]. Should I order that?

## 2016-10-31 NOTE — Telephone Encounter (Signed)
OK 

## 2016-11-01 ENCOUNTER — Other Ambulatory Visit: Payer: Self-pay

## 2016-11-01 DIAGNOSIS — E119 Type 2 diabetes mellitus without complications: Secondary | ICD-10-CM

## 2016-11-01 NOTE — Telephone Encounter (Signed)
I have order labs for [facility] & faxed over the requisition. .Marland Kitchen

## 2016-11-29 ENCOUNTER — Telehealth: Payer: Self-pay | Admitting: Endocrinology

## 2016-11-29 NOTE — Telephone Encounter (Signed)
Please call patient at ph# [phone]: blood work and [facility]

## 2016-12-01 NOTE — Telephone Encounter (Signed)
Labreq faxed

## 2016-12-01 NOTE — Telephone Encounter (Signed)
I called patient & notified them that I had faxed that lab requisition on 10/30. He asked if I would fax requisition again & I have printed it off to fax.

## 2016-12-01 NOTE — Telephone Encounter (Signed)
Pt wants blood work orders sent to [facility]. Pt has not heard back as to whether this has been done or what he needs to do to get it before his appt . Call pt [phone].

## 2016-12-06 ENCOUNTER — Other Ambulatory Visit: Payer: Self-pay | Admitting: Endocrinology

## 2016-12-06 DIAGNOSIS — E119 Type 2 diabetes mellitus without complications: Secondary | ICD-10-CM | POA: Diagnosis not present

## 2016-12-07 LAB — HEMOGLOBIN A1C
Est. average glucose Bld gHb Est-mCnc: 146 mg/dL
Hgb A1c MFr Bld: 6.7 % — ABNORMAL HIGH (ref 4.8–5.6)

## 2016-12-09 ENCOUNTER — Ambulatory Visit: Payer: BLUE CROSS/BLUE SHIELD | Admitting: Endocrinology

## 2016-12-09 ENCOUNTER — Ambulatory Visit (INDEPENDENT_AMBULATORY_CARE_PROVIDER_SITE_OTHER): Payer: BLUE CROSS/BLUE SHIELD | Admitting: Endocrinology

## 2016-12-09 ENCOUNTER — Encounter: Payer: Self-pay | Admitting: Endocrinology

## 2016-12-09 VITALS — BP 92/70 | HR 106 | Wt 156.4 lb

## 2016-12-09 DIAGNOSIS — E119 Type 2 diabetes mellitus without complications: Secondary | ICD-10-CM

## 2016-12-09 MED ORDER — SITAGLIP PHOS-METFORMIN HCL ER 50-1000 MG PO TB24: 2.0000 | ORAL_TABLET | Freq: Every day | ORAL | 11 refills | Status: DC

## 2016-12-09 NOTE — Progress Notes (Signed)
Subjective:    Patient ID: Jordan Holmes, male    DOB: 04/29/1960, 56 y.o.   MRN: [id number]  HPI Pt returns for f/u of diabetes mellitus:  DM type: 2 Dx'ed: 2010 Complications: none Therapy: 2 oral meds.  DKA: never Severe hypoglycemia: never.  Pancreatitis: never Pancreatic imaging:  Other: pioglitizone is considered, due to risk of hypertrigliceridemia Interval history: Meter is downloaded today, and the printout is scanned into the record.  It varies from 121-149.  There is no trend throughout the day.  pt states he feels well in general. Past Medical History:  Diagnosis Date  . Allergy   . Bronchiectasis (HCC)   . Diabetes mellitus   . HIV infection (HCC)   . Hyperlipidemia   . Multiple lung nodules on CT   . Seasonal allergies     Past Surgical History:  Procedure Laterality Date  . VIDEO BRONCHOSCOPY Bilateral 10/02/2015   Procedure: VIDEO BRONCHOSCOPY WITH FLUORO;  Surgeon: Roslynn AmbleJennings E Nestor, MD;  [facility];  Service: Cardiopulmonary;  Laterality: Bilateral;    Social History   Socioeconomic History  . Marital status: Married    Spouse name: Not on file  . Number of children: 2  . Years of education: Not on file  . Highest education level: Not on file  Social Needs  . Financial resource strain: Not on file  . Food insecurity - worry: Not on file  . Food insecurity - inability: Not on file  . Transportation needs - medical: Not on file  . Transportation needs - non-medical: Not on file  Occupational History  . Occupation: IT  Tobacco Use  . Smoking status: Never Smoker  . Smokeless tobacco: Never Used  . Tobacco comment: Second-hand exposure through his father  Substance and Sexual Activity  . Alcohol use: No    Alcohol/week: 0.0 oz  . Drug use: No  . Sexual activity: Yes    Partners: Female    Birth control/protection: Condom    Comment: declined condoms  Other Topics Concern  . Not on file  Social History Narrative   Originally  from [place]. [place]. He has also lived in [place], [place], and also [place]. He has traveled all over the [place]. No pets currently. No bird exposure. No mold or hot tub exposure. 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 Enjoys playing tennis & walking.    Current Outpatient Medications on File Prior to Visit  Medication Sig Dispense Refill  . atorvastatin (LIPITOR) 10 MG tablet Take 1 tablet (10 mg total) by mouth daily. 90 tablet 3  . bictegravir-emtricitabine-tenofovir AF (BIKTARVY) 50-200-25 MG TABS tablet Take 1 tablet by mouth daily. 90 tablet 3  . Cholecalciferol (VITAMIN D3) 3000 units TABS Take 1 tablet by mouth daily.    Marland Kitchen. glucose blood (ONETOUCH VERIO) test strip 1 each by Other route daily. Use as instructed 100 each 3  . Omega-3 Fatty Acids (FISH OIL) 1200 MG CPDR Take 12,000 mg by mouth daily.    Marland Kitchen. Respiratory Therapy Supplies (FLUTTER) DEVI To use as directed 1 each 0  . Respiratory Therapy Supplies (FLUTTER) DEVI 1 Device by Does not apply route daily. 1 each 0   No current facility-administered medications on file prior to visit.     Allergies  Allergen Reactions  . Amoxicillin Nausea And Vomiting    Fever and vomiting Diarrhea & fever    Family History  Problem Relation Age of Onset  . Lung disease Neg  Hx   . Rheumatologic disease Neg Hx   . Diabetes Neg Hx     BP 92/70 (BP Location: Left Arm, Patient Position: Sitting, Cuff Size: Normal)   Pulse (!) 106   Wt 156 lb 6.4 oz (70.9 kg)   SpO2 97%   BMI 26.03 kg/m    Review of Systems He denies hypoglycemia    Objective:   Physical Exam VITAL SIGNS:  See vs page GENERAL: no distress Pulses: foot pulses are intact bilaterally.   MSK: no deformity of the feet or ankles.  CV: no edema of the legs or ankles Skin:  no ulcer on the feet or ankles.  normal color and temp on the feet and ankles Neuro: sensation is intact to touch on the feet and ankles.    Lab Results  Component Value Date   CREATININE  1.19 04/07/2016   BUN 11 04/07/2016   NA 136 04/07/2016   K 4.2 04/07/2016   CL 102 04/07/2016   CO2 25 04/07/2016   Lab Results  Component Value Date   CHOL 153 04/07/2016   HDL 38 (L) 04/07/2016   LDLCALC 69 04/07/2016   TRIG 229 (H) 04/07/2016   CHOLHDL 4.0 04/07/2016     Lab Results  Component Value Date   HGBA1C 6.7 (H) 12/06/2016      Assessment & Plan:  Type 2 DM: well-controlled.   Hypertriglyceridemia: he would benefit from adding pioglitizone, but he declines.    Patient Instructions  I have sent a prescription to your pharmacy, to combine the [place]uvia and metformin.  check your blood sugar once a day.  vary the time of day when you check, between before the 3 meals, and at bedtime.  also check if you have symptoms of your blood sugar being too high or too low.  please keep a record of the readings and bring it to your next appointment here (or you can bring the meter itself).  You can write it on any piece of paper.  please call us sooner if your blood sugar goes below 70, or if you have a lot of readings over 200.   Please come back for a follow-up appointment in 4-6 months.

## 2016-12-09 NOTE — Patient Instructions (Addendum)
I have sent a prescription to your pharmacy, to combine the [place]uvia and metformin.  check your blood sugar once a day.  vary the time of day when you check, between before the 3 meals, and at bedtime.  also check if you have symptoms of your blood sugar being too high or too low.  please keep a record of the readings and bring it to your next appointment here (or you can bring the meter itself).  You can write it on any piece of paper.  please call us sooner if your blood sugar goes below 70, or if you have a lot of readings over 200.   Please come back for a follow-up appointment in 4-6 months.

## 2017-01-22 IMAGING — CT CT CHEST W/ CM
1 of 2 series · 14 of 32 positions shown, 18 images · IV contrast (APPLIED)
Comparison: None.

CLINICAL DATA: Patient with multiple months of wheezing. Abnormal
chest radiograph.

EXAM:
CT CHEST WITH CONTRAST
TECHNIQUE: Multidetector CT imaging of the chest was performed during
intravenous contrast administration.
BUN and creatinine were obtained on site at [HOSPITAL] at
[HOSPITAL].
Results:  BUN 12 mg/dL,  Creatinine 1.1 mg/dL.
CONTRAST:  75mL 6YNF7C-233 IOPAMIDOL ([id number]-233) INJECTION 61%

[Series 3: chest w/cm · axial · 0.65mm/px · z∈[-313,-58]mm · 14 of 61 slices shown, 18 images]
[im 5/61  mediastinal]
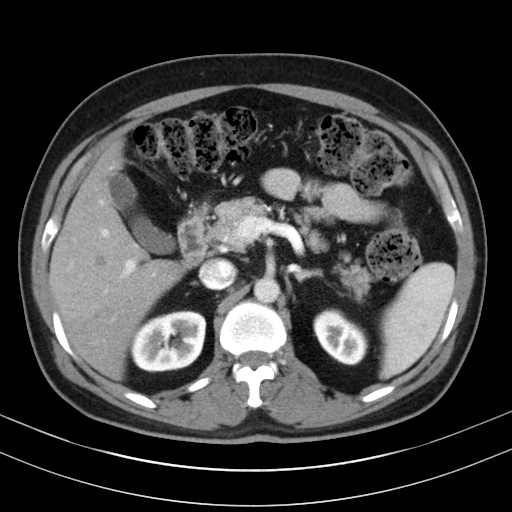
[im 5/61  lung]
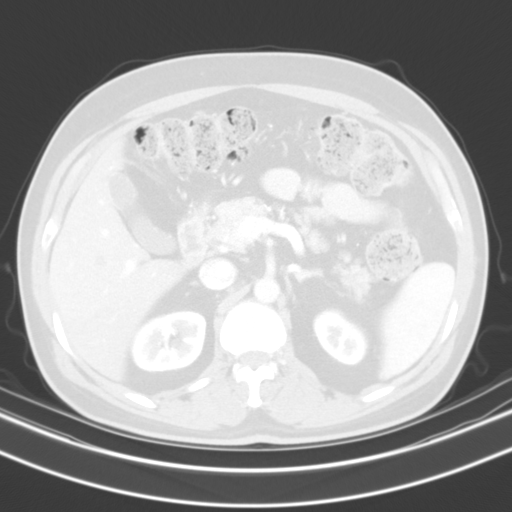
[im 10/61  lung]
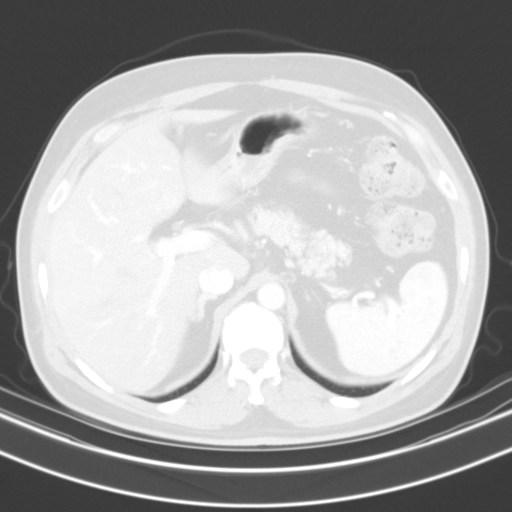
[im 14/61  lung]
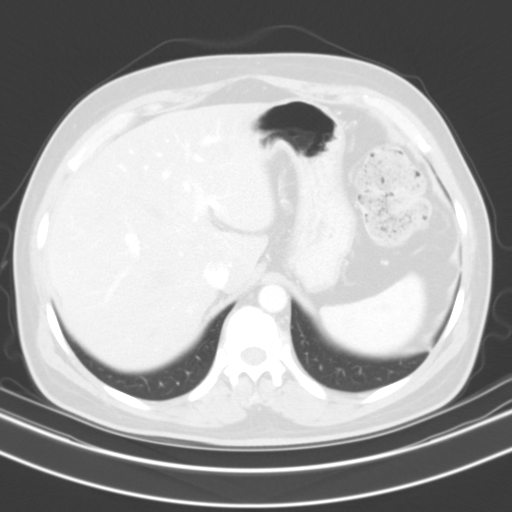
[im 19/61  lung]
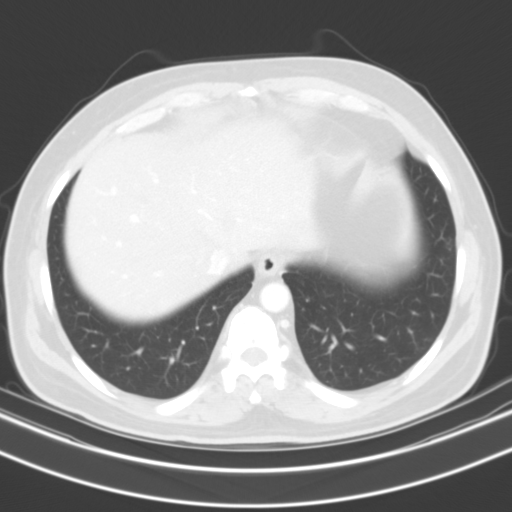
[im 24/61  mediastinal]
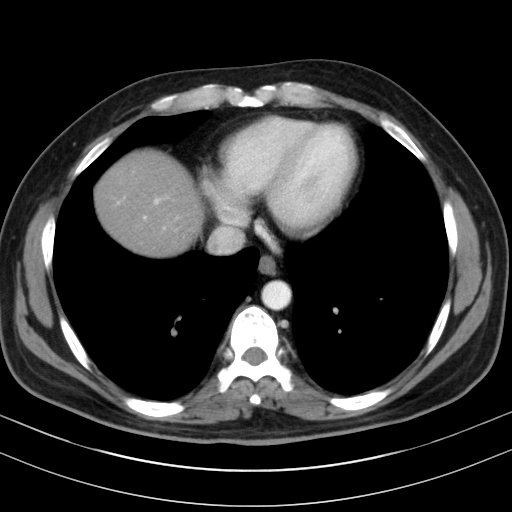
[im 24/61  lung]
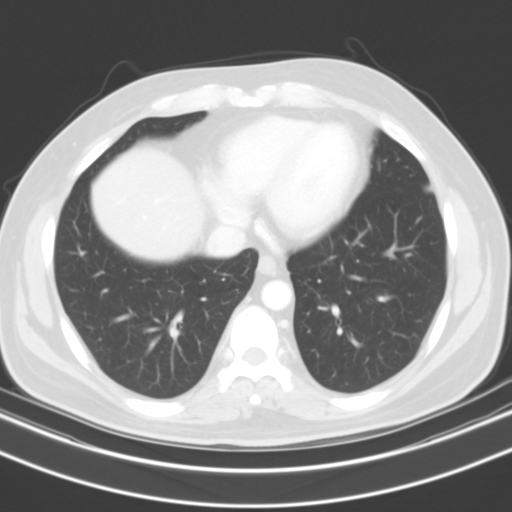
[im 28/61  lung]
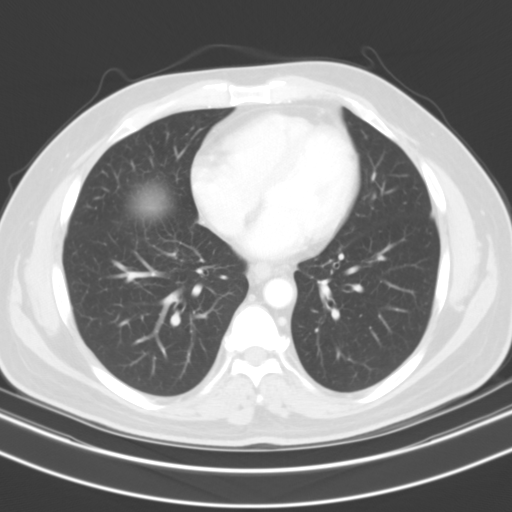
[im 29/61  lung]
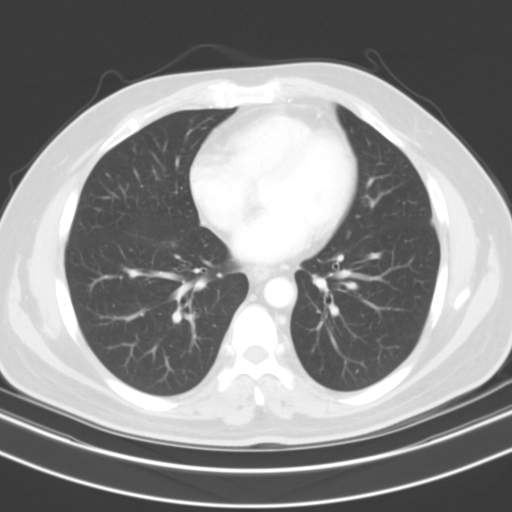
[im 31/61  lung]
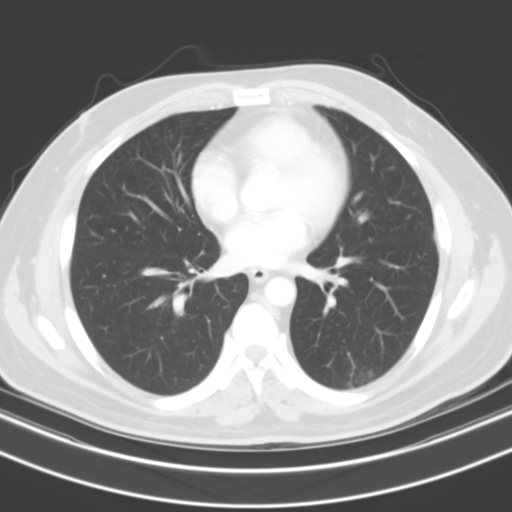
[im 33/61  mediastinal]
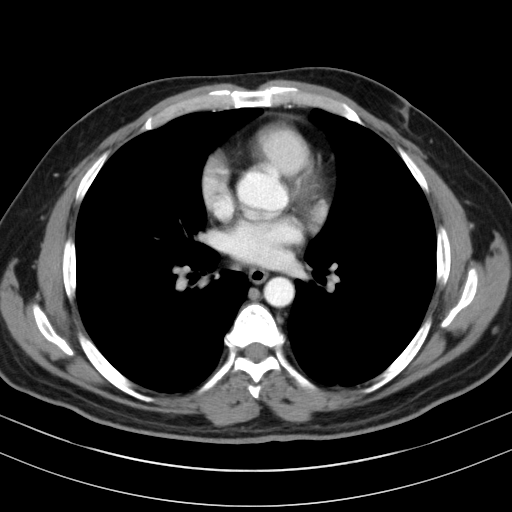
[im 33/61  lung]
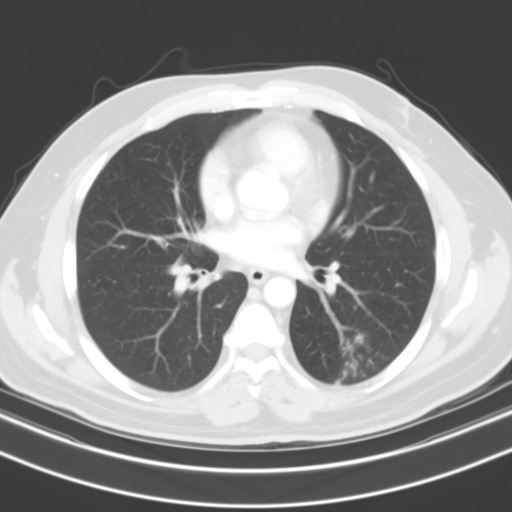
[im 37/61  lung]
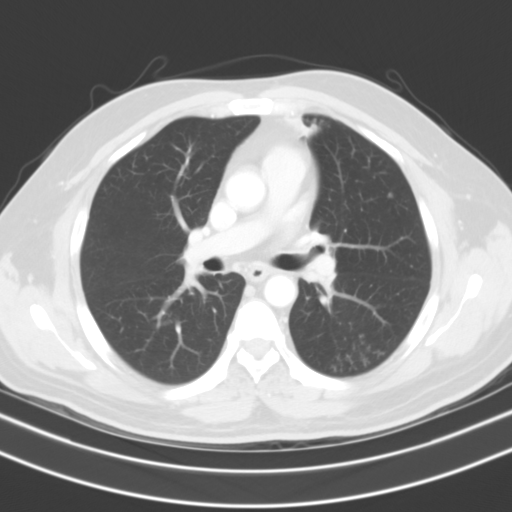
[im 42/61  lung]
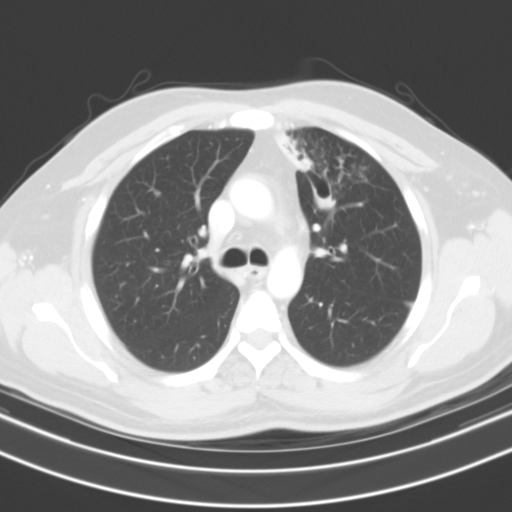
[im 47/61  lung]
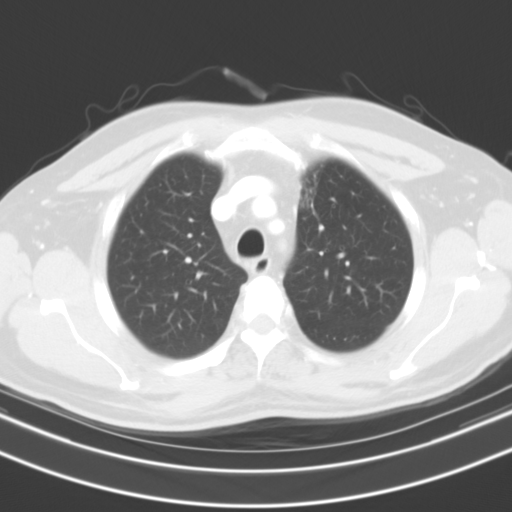
[im 51/61  mediastinal]
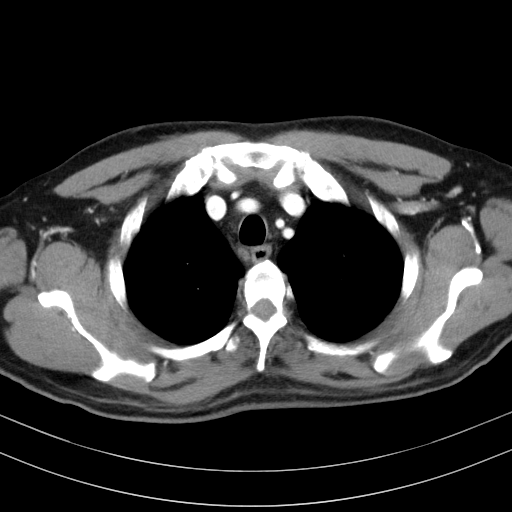
[im 51/61  lung]
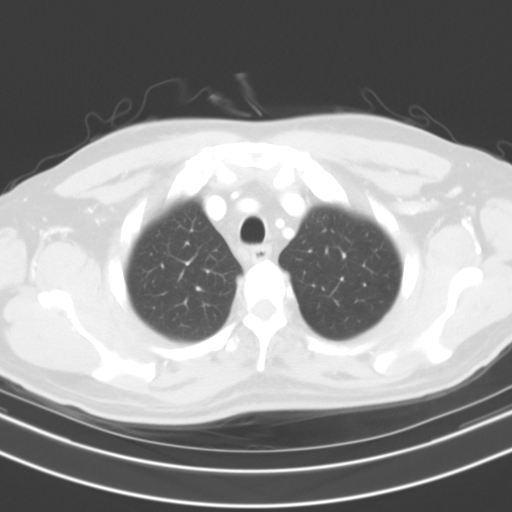
[im 56/61  lung]
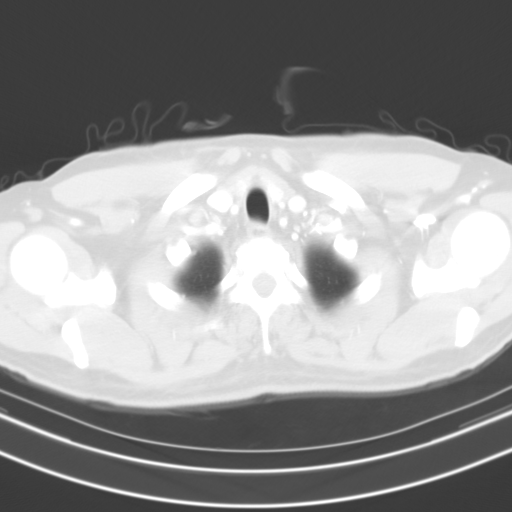

[14 of 32 positions shown; findings below may reference images not displayed]

FINDINGS: Mediastinum/Nodes: No enlarged axillary, mediastinal or hilar
lymphadenopathy. Normal heart size. Aorta main pulmonary artery are
normal in caliber. No pericardial effusion.

Lungs/Pleura: Central airways are patent. Multiple tree-in-bud
nodular opacities are demonstrated within the superior left lower
lobe. Additionally within the left upper lobe there is a focal area
of bronchiectasis with endobronchial fluid. There is surrounding
ground-glass and nodular opacification within the left upper lobe.
No pleural effusion or pneumothorax.

Upper abdomen: Liver is normal in size and contour. Near the hepatic
dome there is a 2.1 cm robust enhancing mass. Gallbladder is
unremarkable. No intrahepatic or extrahepatic biliary ductal
dilatation. Adrenal glands are normal.

Musculoskeletal: No aggressive or acute appearing osseous lesions.
IMPRESSION: Bronchiectasis within the left upper lobe with surrounding
ground-glass and nodular opacities concerning for sequelae of
bronchopneumonia or atypical infection. Ground-glass and tree-in-bud
nodular opacities within the left lower lobe are concerning for
associated endobronchial spread of infection.

Consider pulmonary consultation.

There is a 2.1 cm enhancing mass within the liver, potentially
representing flash filling hemangioma. Given the size, recommend
dedicated evaluation with pre and post contrast enhanced MRI for
definitive characterization.

## 2017-02-06 ENCOUNTER — Other Ambulatory Visit: Payer: Self-pay | Admitting: *Deleted

## 2017-02-06 DIAGNOSIS — B20 Human immunodeficiency virus [HIV] disease: Secondary | ICD-10-CM

## 2017-02-06 MED ORDER — BICTEGRAVIR-EMTRICITAB-TENOFOV 50-200-25 MG PO TABS: 1.0000 | ORAL_TABLET | Freq: Every day | ORAL | 1 refills | Status: DC

## 2017-03-30 ENCOUNTER — Telehealth: Payer: Self-pay | Admitting: *Deleted

## 2017-03-30 NOTE — Telephone Encounter (Signed)
Thanks

## 2017-03-30 NOTE — Telephone Encounter (Signed)
Patient requests lab orders be sent to [facility] [place] before his visit 4/2. Orders faxed to: P: [phone] F: [phone] Andree CossHowell, Chue Berkovich M, RN

## 2017-03-31 ENCOUNTER — Encounter: Payer: Self-pay | Admitting: Infectious Disease

## 2017-03-31 DIAGNOSIS — B20 Human immunodeficiency virus [HIV] disease: Secondary | ICD-10-CM | POA: Diagnosis not present

## 2017-03-31 DIAGNOSIS — Z113 Encounter for screening for infections with a predominantly sexual mode of transmission: Secondary | ICD-10-CM | POA: Diagnosis not present

## 2017-03-31 DIAGNOSIS — Z79899 Other long term (current) drug therapy: Secondary | ICD-10-CM | POA: Diagnosis not present

## 2017-04-04 ENCOUNTER — Ambulatory Visit: Payer: BLUE CROSS/BLUE SHIELD | Admitting: Internal Medicine

## 2017-04-04 DIAGNOSIS — B20 Human immunodeficiency virus [HIV] disease: Secondary | ICD-10-CM | POA: Diagnosis not present

## 2017-04-04 MED ORDER — BICTEGRAVIR-EMTRICITAB-TENOFOV 50-200-25 MG PO TABS: 1.0000 | ORAL_TABLET | Freq: Every day | ORAL | 2 refills | Status: DC

## 2017-04-04 NOTE — Assessment & Plan Note (Signed)
His CD4 count is varied up and down but overall his HIV infection is under excellent, long-term control.  He will continue Biktarvy and follow-up here after lab work in 1 year.

## 2017-04-04 NOTE — Progress Notes (Signed)
Patient Active Problem List   Diagnosis Date Noted  . HIV disease (HCC) 07/20/2014    Priority: High  . Nocturnal emission 10/04/2016  . Constipation 04/07/2016  . Penile pain 04/07/2016  . Depression 04/07/2016  . Lung nodules   . Multiple lung nodules on CT 08/14/2014  . Bronchiectasis without acute exacerbation (HCC) 08/14/2014  . Wheezing 08/14/2014  . Seasonal allergies 07/20/2014  . Hypertriglyceridemia 11/16/2010  . Diabetes mellitus, type 2 (HCC) 11/16/2010    Patient's Medications  New Prescriptions   No medications on file  Previous Medications   ATORVASTATIN (LIPITOR) 10 MG TABLET    Take 1 tablet (10 mg total) by mouth daily.   CHOLECALCIFEROL (VITAMIN D3) 3000 UNITS TABS    Take 1 tablet by mouth daily.   GLUCOSE BLOOD (ONETOUCH VERIO) TEST STRIP    1 each by Other route daily. Use as instructed   OMEGA-3 FATTY ACIDS (FISH OIL) 1200 MG CPDR    Take 12,000 mg by mouth daily.   PREDNISONE (STERAPRED UNI-PAK 21 TAB) 10 MG (21) TBPK TABLET    TABLETS BY MOUTH DAILY 6 DAY STEP DOWN DOSE   SITAGLIPTIN-METFORMIN HCL (JANUMET XR) 50-1000 MG TB24    Take 2 tablets by mouth daily.  Modified Medications   Modified Medication Previous Medication   BICTEGRAVIR-EMTRICITABINE-TENOFOVIR AF (BIKTARVY) 50-200-25 MG TABS TABLET bictegravir-emtricitabine-tenofovir AF (BIKTARVY) 50-200-25 MG TABS tablet      Take 1 tablet by mouth daily.    Take 1 tablet by mouth daily.  Discontinued Medications   RESPIRATORY THERAPY SUPPLIES (FLUTTER) DEVI    To use as directed   RESPIRATORY THERAPY SUPPLIES (FLUTTER) DEVI    1 Device by Does not apply route daily.    Subjective: Jordan Holmes is in for his routine HIV follow-up visit.  He has had no problems obtaining, taking or tolerating his Biktarvy after a very short adjustment.  When he switched last year.  He had some initial headaches that resolved after about 1 week.  He denies missing any doses.  He says that he has occasional periods  when he feels slightly sad but does not really feel like he is depressed.  However he says that he is not sure how he would know if he is depressed.  He is working with Dr. Melford AaseShaun Ellison on getting better control of his diabetes and cholesterol.  Review of Systems: Review of Systems  Constitutional: Negative for chills, diaphoresis, fever, malaise/fatigue and weight loss.  HENT: Negative for sore throat.   Respiratory: Negative for cough, sputum production and shortness of breath.   Cardiovascular: Negative for chest pain.  Gastrointestinal: Negative for abdominal pain, diarrhea, heartburn, nausea and vomiting.  Genitourinary: Negative for dysuria and frequency.  Musculoskeletal: Negative for joint pain and myalgias.  Skin: Negative for rash.  Neurological: Negative for dizziness and headaches.  Psychiatric/Behavioral: Negative for depression and substance abuse. The patient is not nervous/anxious.     Past Medical History:  Diagnosis Date  . Allergy   . Bronchiectasis (HCC)   . Diabetes mellitus   . HIV infection (HCC)   . Hyperlipidemia   . Multiple lung nodules on CT   . Seasonal allergies     Social History   Tobacco Use  . Smoking status: Never Smoker  . Smokeless tobacco: Never Used  . Tobacco comment: Second-hand exposure through his father  Substance Use Topics  . Alcohol use: No    Alcohol/week: 0.0 oz  .  Drug use: No    Family History  Problem Relation Age of Onset  . Lung disease Neg Hx   . Rheumatologic disease Neg Hx   . Diabetes Neg Hx     Allergies  Allergen Reactions  . Amoxicillin Nausea And Vomiting    Fever and vomiting Diarrhea & fever    Health Maintenance  Topic Date Due  . FOOT EXAM  02/04/1972  . OPHTHALMOLOGY EXAM  02/04/1972  . URINE MICROALBUMIN  02/04/1972  . COLONOSCOPY  02/04/2012  . PNEUMOCOCCAL POLYSACCHARIDE VACCINE (2) 11/16/2015  . HEMOGLOBIN A1C  06/06/2017  . INFLUENZA VACCINE  08/03/2017  . TETANUS/TDAP  11/15/2020    . Hepatitis C Screening  Completed  . HIV Screening  Completed    Objective:  There were no vitals filed for this visit. There is no height or weight on file to calculate BMI.  Physical Exam  Constitutional: He is oriented to person, place, and time.  He is in good spirits.  HENT:  Mouth/Throat: No oropharyngeal exudate.  Eyes: Conjunctivae are normal.  Neck: Neck supple.  Cardiovascular: Normal rate and regular rhythm.  No murmur heard. Pulmonary/Chest: Effort normal and breath sounds normal. He has no wheezes. He has no rales.  Abdominal: Soft. He exhibits no mass. There is no tenderness.  Musculoskeletal: Normal range of motion.  Neurological: He is alert and oriented to person, place, and time.  Skin: No rash noted.  Psychiatric: Mood and affect normal.    Lab Results Lab Results  Component Value Date   WBC 5.7 04/07/2016   HGB 13.1 (L) 04/07/2016   HCT 41.9 04/07/2016   MCV 72.0 (L) 04/07/2016   PLT 306 04/07/2016    Lab Results  Component Value Date   CREATININE 1.19 04/07/2016   BUN 11 04/07/2016   NA 136 04/07/2016   K 4.2 04/07/2016   CL 102 04/07/2016   CO2 25 04/07/2016    Lab Results  Component Value Date   ALT 16 04/07/2016   AST 19 04/07/2016   ALKPHOS 84 04/07/2016   BILITOT 0.3 04/07/2016    Lab Results  Component Value Date   CHOL 153 04/07/2016   HDL 38 (L) 04/07/2016   LDLCALC 69 04/07/2016   TRIG 229 (H) 04/07/2016   CHOLHDL 4.0 04/07/2016   Lab Results  Component Value Date   LABRPR NON REAC 04/07/2016   HIV 1 RNA Quant (copies/mL)  Date Value  04/07/2016 24 (H)  06/24/2014 <20   CD4 T Cell Abs (/uL)  Date Value  04/07/2016 600  06/24/2014 450   HIV viral load 03/31/2017 less than 20 CD4 count 03/31/2017 to 90   Problem List Items Addressed This Visit      High   HIV disease (HCC)    His CD4 count is varied up and down but overall his HIV infection is under excellent, long-term control.  He will continue Biktarvy and  follow-up here after lab work in 1 year.      Relevant Medications   bictegravir-emtricitabine-tenofovir AF (BIKTARVY) 50-200-25 MG TABS tablet   Other Relevant Orders   CBC   T-helper cell (CD4)- ([facility] clinic only)   Comprehensive metabolic panel   Lipid panel   RPR   HIV 1 RNA quant-no reflex-bld        Cliffton Asters, MD [facility] [phone] pager   [phone] cell 04/04/2017, 5:07 PM

## 2017-04-13 ENCOUNTER — Other Ambulatory Visit: Payer: Self-pay

## 2017-04-13 ENCOUNTER — Encounter: Payer: Self-pay | Admitting: Endocrinology

## 2017-04-13 ENCOUNTER — Ambulatory Visit: Payer: BLUE CROSS/BLUE SHIELD | Admitting: Endocrinology

## 2017-04-13 VITALS — BP 100/62 | HR 97 | Wt 148.2 lb

## 2017-04-13 DIAGNOSIS — E119 Type 2 diabetes mellitus without complications: Secondary | ICD-10-CM | POA: Diagnosis not present

## 2017-04-13 LAB — POCT GLYCOSYLATED HEMOGLOBIN (HGB A1C): Hemoglobin A1C: 6.6

## 2017-04-13 MED ORDER — GLUCOSE BLOOD VI STRP: 1.0000 | ORAL_STRIP | Freq: Every day | 3 refills | Status: DC

## 2017-04-13 MED ORDER — SITAGLIP PHOS-METFORMIN HCL ER 50-1000 MG PO TB24: 2.0000 | ORAL_TABLET | Freq: Every day | ORAL | 11 refills | Status: DC

## 2017-04-13 MED ORDER — ATORVASTATIN CALCIUM 10 MG PO TABS: 10.0000 mg | ORAL_TABLET | Freq: Every day | ORAL | 3 refills | Status: DC

## 2017-04-13 NOTE — Progress Notes (Signed)
Subjective:    Patient ID: Jordan Holmes, male    DOB: 10/06/1960, 57 y.o.   MRN: [id number]  HPI Pt returns for f/u of diabetes mellitus:  DM type: 2 (but he may be developing type 1--lean body habitus and neg FHx).  Dx'ed: 2010 Complications: none Therapy: janumet DKA: never Severe hypoglycemia: never.  Pancreatitis: never Pancreatic imaging: never Other: pioglitizone is considered, due to hypertrigliceridemia. Interval history: Meter is downloaded today, and the printout is scanned into the record.  It varies from 140-180.  There are only 3 cbg's--all fasting.  pt states he feels well in general. Past Medical History:  Diagnosis Date  . Allergy   . Bronchiectasis (HCC)   . Diabetes mellitus   . HIV infection (HCC)   . Hyperlipidemia   . Multiple lung nodules on CT   . Seasonal allergies     Past Surgical History:  Procedure Laterality Date  . VIDEO BRONCHOSCOPY Bilateral 10/02/2015   Procedure: VIDEO BRONCHOSCOPY WITH FLUORO;  Surgeon: Roslynn AmbleJennings E Nestor, MD;  [facility];  Service: Cardiopulmonary;  Laterality: Bilateral;    Social History   Socioeconomic History  . Marital status: Married    Spouse name: Not on file  . Number of children: 2  . Years of education: Not on file  . Highest education level: Not on file  Occupational History  . Occupation: IT  Social Needs  . Financial resource strain: Not on file  . Food insecurity:    Worry: Not on file    Inability: Not on file  . Transportation needs:    Medical: Not on file    Non-medical: Not on file  Tobacco Use  . Smoking status: Never Smoker  . Smokeless tobacco: Never Used  . Tobacco comment: Second-hand exposure through his father  Substance and Sexual Activity  . Alcohol use: No    Alcohol/week: 0.0 oz  . Drug use: No  . Sexual activity: Yes    Partners: Female    Birth control/protection: Condom    Comment: declined condoms  Lifestyle  . Physical activity:    Days per week: Not on  file    Minutes per session: Not on file  . Stress: Not on file  Relationships  . Social connections:    Talks on phone: Not on file    Gets together: Not on file    Attends religious service: Not on file    Active member of club or organization: Not on file    Attends meetings of clubs or organizations: Not on file    Relationship status: Not on file  . Intimate partner violence:    Fear of current or ex partner: Not on file    Emotionally abused: Not on file    Physically abused: Not on file    Forced sexual activity: Not on file  Other Topics Concern  . Not on file  Social History Narrative   Originally from [place]. [place] move [place]. He has also lived in [place], [place], and also [place]. He has traveled all over the [place]. No pets currently. No bird exposure. No mold or hot tub exposure. 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 Enjoys playing tennis & walking.    Current Outpatient Medications on File Prior to Visit  Medication Sig Dispense Refill  . bictegravir-emtricitabine-tenofovir AF (BIKTARVY) 50-200-25 MG TABS tablet Take 1 tablet by mouth daily. 90 tablet 2  . Cholecalciferol (VITAMIN D3) 3000 units TABS Take 1 tablet by  mouth daily.    . Omega-3 Fatty Acids (FISH OIL) 1200 MG CPDR Take 12,000 mg by mouth daily.     No current facility-administered medications on file prior to visit.     Allergies  Allergen Reactions  . Amoxicillin Nausea And Vomiting    Fever and vomiting Diarrhea & fever    Family History  Problem Relation Age of Onset  . Lung disease Neg Hx   . Rheumatologic disease Neg Hx   . Diabetes Neg Hx     BP 100/62 (BP Location: Left Arm, Patient Position: Sitting, Cuff Size: Normal)   Pulse 97   Wt 148 lb 3.2 oz (67.2 kg)   SpO2 97%   BMI 24.66 kg/m    Review of Systems Pt has 1 month of pain at the left elbow.  No injury. Prednisone did not help.      Objective:   Physical Exam VITAL SIGNS:  See vs page GENERAL: no distress Pulses:  dorsalis pedis intact bilat.   MSK: no deformity of the feet CV: no leg edema Skin:  no ulcer on the feet.  normal color and temp on the feet.  Neuro: sensation is intact to touch on the feet.  Left elbow: normal to my exam.   Lab Results  Component Value Date   HGBA1C 6.6 04/13/2017   Lab Results  Component Value Date   CHOL 153 04/07/2016   HDL 38 (L) 04/07/2016   LDLCALC 69 04/07/2016   TRIG 229 (H) 04/07/2016   CHOLHDL 4.0 04/07/2016   Lab Results  Component Value Date   CREATININE 1.19 04/07/2016   BUN 11 04/07/2016   NA 136 04/07/2016   K 4.2 04/07/2016   CL 102 04/07/2016   CO2 25 04/07/2016        Assessment & Plan:  Type 2 DM: well-controlled. Elbow pain, new to me, uncertain etiology.  Prednisone could be affecting a1c.  Dyslipidemia: if we need to add another oral med, pioglitizone is favored   Patient Instructions  Please continue the same medication for diabetes. check your blood sugar once a day.  vary the time of day when you check, between before the 3 meals, and at bedtime.  also check if you have symptoms of your blood sugar being too high or too low.  please keep a record of the readings and bring it to your next appointment here (or you can bring the meter itself).  You can write it on any piece of paper.  please call us sooner if your blood sugar goes below 70, or if you have a lot of readings over 200.   You should see a specialist if the pain does not improve.   Please come back for a follow-up appointment in 6 months.

## 2017-04-13 NOTE — Patient Instructions (Addendum)
Please continue the same medication for diabetes. check your blood sugar once a day.  vary the time of day when you check, between before the 3 meals, and at bedtime.  also check if you have symptoms of your blood sugar being too high or too low.  please keep a record of the readings and bring it to your next appointment here (or you can bring the meter itself).  You can write it on any piece of paper.  please call us sooner if your blood sugar goes below 70, or if you have a lot of readings over 200.   You should see a specialist if the pain does not improve.   Please come back for a follow-up appointment in 6 months.

## 2017-04-17 DIAGNOSIS — M7710 Lateral epicondylitis, unspecified elbow: Secondary | ICD-10-CM | POA: Diagnosis not present

## 2017-04-27 ENCOUNTER — Other Ambulatory Visit: Payer: Self-pay

## 2017-04-27 ENCOUNTER — Telehealth: Payer: Self-pay | Admitting: Endocrinology

## 2017-04-27 NOTE — Telephone Encounter (Signed)
atorvastatin (LIPITOR) 10 MG tablet  Jordan Holmes with [facility] is calling about a prescription that was sent over to them.  In order from them to fill this they need someone to call and approve this. Or send in a new prescription.    [phone]  [facility] [place] [address]

## 2017-04-27 NOTE — Telephone Encounter (Signed)
I spoke with a pharmacist with [facility] & gave the verbal for atorvastatin because [facility] [place] [facility] [place]. It needed to be sent to the regular [facility] pharmacy in [place].

## 2017-05-16 DIAGNOSIS — M7712 Lateral epicondylitis, left elbow: Secondary | ICD-10-CM | POA: Diagnosis not present

## 2017-05-16 DIAGNOSIS — M25522 Pain in left elbow: Secondary | ICD-10-CM | POA: Diagnosis not present

## 2017-05-25 DIAGNOSIS — M25522 Pain in left elbow: Secondary | ICD-10-CM | POA: Diagnosis not present

## 2017-05-25 DIAGNOSIS — M7712 Lateral epicondylitis, left elbow: Secondary | ICD-10-CM | POA: Diagnosis not present

## 2017-06-09 ENCOUNTER — Ambulatory Visit: Payer: BLUE CROSS/BLUE SHIELD | Admitting: Endocrinology

## 2017-06-21 ENCOUNTER — Other Ambulatory Visit: Payer: Self-pay | Admitting: Internal Medicine

## 2017-06-21 DIAGNOSIS — B20 Human immunodeficiency virus [HIV] disease: Secondary | ICD-10-CM

## 2017-06-22 DIAGNOSIS — M25522 Pain in left elbow: Secondary | ICD-10-CM | POA: Diagnosis not present

## 2017-06-22 DIAGNOSIS — M7712 Lateral epicondylitis, left elbow: Secondary | ICD-10-CM | POA: Diagnosis not present

## 2017-08-03 DIAGNOSIS — M533 Sacrococcygeal disorders, not elsewhere classified: Secondary | ICD-10-CM | POA: Diagnosis not present

## 2017-08-03 DIAGNOSIS — M5418 Radiculopathy, sacral and sacrococcygeal region: Secondary | ICD-10-CM | POA: Diagnosis not present

## 2017-08-03 DIAGNOSIS — M5441 Lumbago with sciatica, right side: Secondary | ICD-10-CM | POA: Diagnosis not present

## 2017-08-23 DIAGNOSIS — Z0000 Encounter for general adult medical examination without abnormal findings: Secondary | ICD-10-CM | POA: Diagnosis not present

## 2017-08-23 DIAGNOSIS — Z23 Encounter for immunization: Secondary | ICD-10-CM | POA: Diagnosis not present

## 2017-10-04 DIAGNOSIS — E119 Type 2 diabetes mellitus without complications: Secondary | ICD-10-CM | POA: Diagnosis not present

## 2017-10-04 DIAGNOSIS — D3131 Benign neoplasm of right choroid: Secondary | ICD-10-CM | POA: Diagnosis not present

## 2017-10-04 DIAGNOSIS — H16223 Keratoconjunctivitis sicca, not specified as Sjogren's, bilateral: Secondary | ICD-10-CM | POA: Diagnosis not present

## 2017-10-04 DIAGNOSIS — H2513 Age-related nuclear cataract, bilateral: Secondary | ICD-10-CM | POA: Diagnosis not present

## 2017-10-11 DIAGNOSIS — Z23 Encounter for immunization: Secondary | ICD-10-CM | POA: Diagnosis not present

## 2017-10-20 ENCOUNTER — Ambulatory Visit: Payer: BLUE CROSS/BLUE SHIELD | Admitting: Endocrinology

## 2017-11-11 ENCOUNTER — Other Ambulatory Visit: Payer: Self-pay | Admitting: Endocrinology

## 2017-11-11 NOTE — Telephone Encounter (Signed)
Please refill x 1 Ov is due  

## 2017-11-20 ENCOUNTER — Other Ambulatory Visit: Payer: Self-pay | Admitting: Internal Medicine

## 2017-11-20 DIAGNOSIS — B20 Human immunodeficiency virus [HIV] disease: Secondary | ICD-10-CM

## 2018-02-06 ENCOUNTER — Other Ambulatory Visit: Payer: Self-pay | Admitting: Endocrinology

## 2018-02-13 ENCOUNTER — Telehealth: Payer: Self-pay | Admitting: Endocrinology

## 2018-02-13 ENCOUNTER — Other Ambulatory Visit: Payer: Self-pay

## 2018-02-13 DIAGNOSIS — E119 Type 2 diabetes mellitus without complications: Secondary | ICD-10-CM

## 2018-02-13 MED ORDER — SITAGLIP PHOS-METFORMIN HCL ER 50-1000 MG PO TB24: 2.0000 | ORAL_TABLET | Freq: Every day | ORAL | 11 refills | Status: DC

## 2018-02-13 NOTE — Telephone Encounter (Signed)
Per [facility] "Caller needs a prescription sent to pharmacy. It is Metformin and Statin."

## 2018-02-13 NOTE — Telephone Encounter (Signed)
Statin is question for PCP

## 2018-02-13 NOTE — Telephone Encounter (Signed)
Called pt and made him aware to discuss refill request with PCP. Verbalized acceptance and understanding.

## 2018-02-13 NOTE — Telephone Encounter (Signed)
Rx refill sent for Janumet. Pt has been scheduled for f/u 03/01/18. Please advise if refill of statin is appropriate

## 2018-03-01 ENCOUNTER — Ambulatory Visit: Payer: BLUE CROSS/BLUE SHIELD | Admitting: Endocrinology

## 2018-03-13 ENCOUNTER — Telehealth: Payer: Self-pay | Admitting: Family Medicine

## 2018-03-13 NOTE — Telephone Encounter (Signed)
error 

## 2018-03-14 ENCOUNTER — Ambulatory Visit (INDEPENDENT_AMBULATORY_CARE_PROVIDER_SITE_OTHER): Payer: BLUE CROSS/BLUE SHIELD | Admitting: Endocrinology

## 2018-03-14 ENCOUNTER — Encounter: Payer: Self-pay | Admitting: Endocrinology

## 2018-03-14 ENCOUNTER — Ambulatory Visit: Payer: BLUE CROSS/BLUE SHIELD | Admitting: Endocrinology

## 2018-03-14 ENCOUNTER — Other Ambulatory Visit: Payer: Self-pay

## 2018-03-14 VITALS — BP 110/58 | HR 87 | Ht 65.0 in | Wt 155.4 lb

## 2018-03-14 DIAGNOSIS — E119 Type 2 diabetes mellitus without complications: Secondary | ICD-10-CM | POA: Diagnosis not present

## 2018-03-14 LAB — POCT GLYCOSYLATED HEMOGLOBIN (HGB A1C): HEMOGLOBIN A1C: 6.9 % — AB (ref 4.0–5.6)

## 2018-03-14 NOTE — Patient Instructions (Signed)
Please continue the same medication for diabetes.  check your blood sugar once a day.  vary the time of day when you check, between before the 3 meals, and at bedtime.  also check if you have symptoms of your blood sugar being too high or too low.  please keep a record of the readings and bring it to your next appointment here (or you can bring the meter itself).  You can write it on any piece of paper.  please call us sooner if your blood sugar goes below 70, or if you have a lot of readings over 200.   Please come back for a follow-up appointment in 6 months.

## 2018-03-14 NOTE — Progress Notes (Signed)
Subjective:    Patient ID: Jordan Holmes, male    DOB: 1960/02/16, 58 y.o.   MRN: [id number]  HPI Pt returns for f/u of diabetes mellitus:  DM type: 2 (but he may be developing type 1--lean body habitus and neg FHx).  Dx'ed: 2010 Complications: PN Therapy: janumet DKA: never Severe hypoglycemia: never.  Pancreatitis: never Pancreatic imaging: never Other: pioglitizone is considered, due to hypertrigliceridemia. Interval history: no cbg record, but states cbg's are in the mid-100's.  pt states he feels well in general.   Past Medical History:  Diagnosis Date  . Allergy   . Bronchiectasis (HCC)   . Diabetes mellitus   . HIV infection (HCC)   . Hyperlipidemia   . Multiple lung nodules on CT   . Seasonal allergies     Past Surgical History:  Procedure Laterality Date  . VIDEO BRONCHOSCOPY Bilateral 10/02/2015   Procedure: VIDEO BRONCHOSCOPY WITH FLUORO;  Surgeon: Roslynn Amble, MD;  Location: [facility] END[facility]COPY;  Service: Cardiopulmonary;  Laterality: Bilateral;    Social History   Socioeconomic History  . Marital status: Married    Spouse name: Not on file  . Number of children: 2  . Years of education: Not on file  . Highest education level: Not on file  Occupational History  . Occupation: IT  Social Needs  . Financial resource strain: Not on file  . Food insecurity:    Worry: Not on file    Inability: Not on file  . Transportation needs:    Medical: Not on file    Non-medical: Not on file  Tobacco Use  . Smoking status: Never Smoker  . Smokeless tobacco: Never Used  . Tobacco comment: Second-hand exposure through his father  Substance and Sexual Activity  . Alcohol use: No    Alcohol/week: 0.0 standard drinks  . Drug use: No  . Sexual activity: Yes    Partners: Female    Birth control/protection: Condom    Comment: declined condoms  Lifestyle  . Physical activity:    Days per week: Not on file    Minutes per session: Not on file  . Stress: Not on file   Relationships  . Social connections:    Talks on phone: Not on file    Gets together: Not on file    Attends religious service: Not on file    Active member of club or organization: Not on file    Attends meetings of clubs or organizations: Not on file    Relationship status: Not on file  . Intimate partner violence:    Fear of current or ex partner: Not on file    Emotionally abused: Not on file    Physically abused: Not on file    Forced sexual activity: Not on file  Other Topics Concern  . Not on file  Social History Narrative   Originally from [place]. He move to the [place] in 2005. He has also lived in [place], [place], and also [place]. He has traveled all over the [place]. No pets currently. No bird exposure. No mold or hot tub exposure. He currently works in Consulting civil engineer. Enjoys playing tennis & walking.    Current Outpatient Medications on File Prior to Visit  Medication Sig Dispense Refill  . atorvastatin (LIPITOR) 10 MG tablet Take 1 tablet (10 mg total) by mouth daily. 90 tablet 3  . bictegravir-emtricitabine-tenofovir AF (BIKTARVY) 50-200-25 MG TABS tablet Take 1 tablet by mouth daily. 90 tablet 2  . Cholecalciferol (VITAMIN  D3) 3000 units TABS Take 1 tablet by mouth daily.    . Omega-3 Fatty Acids (FISH OIL) 1200 MG CPDR Take 12,000 mg by mouth daily.    Letta Pate VERIO test strip TEST ONCE DAILY AS DIRECTED 100 each 0  . SitaGLIPtin-MetFORMIN HCl (JANUMET XR) 50-1000 MG TB24 Take 2 tablets by mouth daily. 180 tablet 11   No current facility-administered medications on file prior to visit.     Allergies  Allergen Reactions  . Amoxicillin Nausea And Vomiting    Fever and vomiting Diarrhea & fever    Family History  Problem Relation Age of Onset  . Lung disease Neg Hx   . Rheumatologic disease Neg Hx   . Diabetes Neg Hx     BP (!) 110/58 (BP Location: Left Arm, Patient Position: Sitting, Cuff Size: Normal)   Pulse 87   Ht 5\' 5"  (1.651 m)   Wt 155 lb 6.4 oz (70.5 kg)   SpO2  97%   BMI 25.86 kg/m    Review of Systems He denies hypoglycemia    Objective:   Physical Exam VITAL SIGNS:  See vs page.   GENERAL: no distress Pulses: dorsalis pedis intact bilat.   MSK: no deformity of the feet CV: no leg edema Skin:  no ulcer on the feet.  normal color and temp on the feet. Neuro: sensation is intact to touch on the feet, but decreased from normal  Lab Results  Component Value Date   HGBA1C 6.9 (A) 03/14/2018        Assessment & Plan:  Type 2 DM, with PN: worse.  We discussed.  He declines to add another med Hypertriglyceridemia: pioglitazone would help, but he declines.   Patient Instructions  Please continue the same medication for diabetes.  check your blood sugar once a day.  vary the time of day when you check, between before the 3 meals, and at bedtime.  also check if you have symptoms of your blood sugar being too high or too low.  please keep a record of the readings and bring it to your next appointment here (or you can bring the meter itself).  You can write it on any piece of paper.  please call us sooner if your blood sugar goes below 70, or if you have a lot of readings over 200.   Please come back for a follow-up appointment in 6 months.

## 2018-03-21 ENCOUNTER — Ambulatory Visit: Payer: BLUE CROSS/BLUE SHIELD | Admitting: Endocrinology

## 2018-04-05 ENCOUNTER — Ambulatory Visit: Payer: BLUE CROSS/BLUE SHIELD | Admitting: Internal Medicine

## 2018-05-24 ENCOUNTER — Ambulatory Visit: Payer: BLUE CROSS/BLUE SHIELD | Admitting: Internal Medicine

## 2018-05-30 ENCOUNTER — Other Ambulatory Visit: Payer: Self-pay | Admitting: Endocrinology

## 2018-05-30 ENCOUNTER — Telehealth: Payer: Self-pay | Admitting: Endocrinology

## 2018-05-30 DIAGNOSIS — E119 Type 2 diabetes mellitus without complications: Secondary | ICD-10-CM

## 2018-05-30 NOTE — Telephone Encounter (Signed)
error 

## 2018-06-04 ENCOUNTER — Other Ambulatory Visit: Payer: Self-pay | Admitting: Endocrinology

## 2018-06-04 DIAGNOSIS — E119 Type 2 diabetes mellitus without complications: Secondary | ICD-10-CM

## 2018-07-19 ENCOUNTER — Ambulatory Visit: Payer: BLUE CROSS/BLUE SHIELD | Admitting: Internal Medicine

## 2018-10-08 ENCOUNTER — Ambulatory Visit: Payer: Self-pay | Admitting: Internal Medicine

## 2018-10-11 ENCOUNTER — Ambulatory Visit: Payer: BLUE CROSS/BLUE SHIELD | Admitting: Endocrinology

## 2018-10-30 ENCOUNTER — Ambulatory Visit: Payer: BLUE CROSS/BLUE SHIELD | Admitting: Endocrinology

## 2018-10-30 ENCOUNTER — Ambulatory Visit: Payer: Self-pay | Admitting: Internal Medicine

## 2018-11-06 ENCOUNTER — Other Ambulatory Visit: Payer: Self-pay

## 2018-11-06 ENCOUNTER — Telehealth: Payer: Self-pay | Admitting: Endocrinology

## 2018-11-06 DIAGNOSIS — E119 Type 2 diabetes mellitus without complications: Secondary | ICD-10-CM

## 2018-11-06 DIAGNOSIS — B20 Human immunodeficiency virus [HIV] disease: Secondary | ICD-10-CM

## 2018-11-06 MED ORDER — BICTEGRAVIR-EMTRICITAB-TENOFOV 50-200-25 MG PO TABS: 1.0000 | ORAL_TABLET | Freq: Every day | ORAL | 1 refills | Status: DC

## 2018-11-06 MED ORDER — JANUMET XR 50-1000 MG PO TB24: 2.0000 | ORAL_TABLET | Freq: Every day | ORAL | 11 refills | Status: DC

## 2018-11-06 NOTE — Telephone Encounter (Signed)
SitaGLIPtin-MetFORMIN HCl (JANUMET XR) 50-1000 MG TB24 180 tablet 11 11/06/2018    Sig - Route: Take 2 tablets by mouth daily. - Oral   Sent to pharmacy as: SitaGLIPtin-MetFORMIN HCl (JANUMET XR) 50-1000 MG Tablet SR 24 hr   E-Prescribing Status: Receipt confirmed by pharmacy (11/06/2018 2:44 PM EST)

## 2018-11-06 NOTE — Telephone Encounter (Signed)
MEDICATION: Janumet  [facility] [address] [facility] [place] [facility]  IS PATIENT OUT OF MEDICATION:   IF NOT; HOW MUCH IS LEFT:   LAST APPOINTMENT DATE: @6 /01/2018  NEXT APPOINTMENT DATE:@12 /02/2018  DO WE HAVE YOUR PERMISSION TO LEAVE A DETAILED MESSAGE:  OTHER COMMENTS:    **Let patient know to contact pharmacy at the end of the day to make sure medication is ready. **  ** Please notify patient to allow 48-72 hours to process**  **Encourage patient to contact the pharmacy for refills or they can request refills through [facility]**

## 2018-11-22 ENCOUNTER — Other Ambulatory Visit: Payer: Self-pay | Admitting: *Deleted

## 2018-11-22 ENCOUNTER — Telehealth: Payer: Self-pay | Admitting: *Deleted

## 2018-11-22 DIAGNOSIS — B20 Human immunodeficiency virus [HIV] disease: Secondary | ICD-10-CM

## 2018-11-22 MED ORDER — BICTEGRAVIR-EMTRICITAB-TENOFOV 50-200-25 MG PO TABS: 1.0000 | ORAL_TABLET | Freq: Every day | ORAL | 0 refills | Status: DC

## 2018-11-22 NOTE — Telephone Encounter (Signed)
[facility] requested refill of biktarvy 11/3, but is unable to fill it due to insurance coverage requiring it be filled at specialty.  [facility] requested the refill 11/19, but found they also are unable to fill it due to insurance.  Patient last seen in clinic 04/2017, our demographic and insurance information is no longer accurate. RN called patient, asked him to please contact his insurance company and find out which specialty pharmacy he must use.  RN asked him to please set up his account with that specialty pharmacy and ask them to send [place] the request for his Biktarvy.  RN is unable to do this without his input as I have no insurance or pharmacy information for him until he comes in.  RN advised it was important for patient to keep his appointment 12/2 with Dr Megan Salon and to call us with questions. Landis Gandy, RN

## 2018-12-05 ENCOUNTER — Ambulatory Visit: Payer: Self-pay | Admitting: Internal Medicine

## 2018-12-05 ENCOUNTER — Ambulatory Visit: Payer: Self-pay | Admitting: Endocrinology

## 2018-12-06 DIAGNOSIS — H2513 Age-related nuclear cataract, bilateral: Secondary | ICD-10-CM | POA: Diagnosis not present

## 2018-12-06 DIAGNOSIS — E083293 Diabetes mellitus due to underlying condition with mild nonproliferative diabetic retinopathy without macular edema, bilateral: Secondary | ICD-10-CM | POA: Diagnosis not present

## 2018-12-06 DIAGNOSIS — D3131 Benign neoplasm of right choroid: Secondary | ICD-10-CM | POA: Diagnosis not present

## 2018-12-06 DIAGNOSIS — E1339 Other specified diabetes mellitus with other diabetic ophthalmic complication: Secondary | ICD-10-CM | POA: Diagnosis not present

## 2018-12-06 LAB — HM DIABETES EYE EXAM

## 2018-12-17 ENCOUNTER — Ambulatory Visit: Payer: Self-pay | Admitting: Endocrinology

## 2018-12-26 ENCOUNTER — Ambulatory Visit: Payer: Self-pay | Admitting: Internal Medicine

## 2019-01-02 ENCOUNTER — Other Ambulatory Visit: Payer: Self-pay

## 2019-01-02 DIAGNOSIS — B20 Human immunodeficiency virus [HIV] disease: Secondary | ICD-10-CM

## 2019-01-02 MED ORDER — BICTEGRAVIR-EMTRICITAB-TENOFOV 50-200-25 MG PO TABS: 1.0000 | ORAL_TABLET | Freq: Every day | ORAL | 0 refills | Status: DC

## 2019-01-02 NOTE — Telephone Encounter (Signed)
Patient called for medication refill on Biktarvy. Patient has not been seen since last December 2019. Patient does has a follow up appointment in place in Feb 2021 and will call the office back later today after he checks his schedule to see if he can come sometime in Jan 2021 by Dr. Megan Salon. Medication refill approved with 0 refills until seen by provider.  Jordan Holmes

## 2019-01-23 ENCOUNTER — Ambulatory Visit: Payer: Self-pay | Admitting: Endocrinology

## 2019-01-23 ENCOUNTER — Other Ambulatory Visit: Payer: Self-pay

## 2019-01-23 ENCOUNTER — Ambulatory Visit (INDEPENDENT_AMBULATORY_CARE_PROVIDER_SITE_OTHER): Payer: BC Managed Care – PPO | Admitting: Internal Medicine

## 2019-01-23 DIAGNOSIS — B20 Human immunodeficiency virus [HIV] disease: Secondary | ICD-10-CM | POA: Diagnosis not present

## 2019-01-23 MED ORDER — BICTEGRAVIR-EMTRICITAB-TENOFOV 50-200-25 MG PO TABS: 1.0000 | ORAL_TABLET | Freq: Every day | ORAL | 11 refills | Status: DC

## 2019-01-23 NOTE — Assessment & Plan Note (Signed)
Although he has not been to clinic in nearly 2 years his adherence with Biktarvy sounds like it has been very good.  I suspect his infection remains under very good long-term control.  He will get blood work today, continue Radio producer and follow-up in 1 year.  He has already received his influenza vaccine.

## 2019-01-23 NOTE — Progress Notes (Signed)
Patient Active Problem List   Diagnosis Date Noted  . HIV disease ([place]) 07/20/2014    Priority: High  . Nocturnal emission 10/04/2016  . Constipation 04/07/2016  . Penile pain 04/07/2016  . Depression 04/07/2016  . Lung nodules   . Multiple lung nodules on CT 08/14/2014  . Bronchiectasis without acute exacerbation ([place]) 08/14/2014  . Wheezing 08/14/2014  . Seasonal allergies 07/20/2014  . Hypertriglyceridemia 11/16/2010  . Diabetes mellitus, type 2 ([place]) 11/16/2010    Patient's Medications  New Prescriptions   No medications on file  Previous Medications   ATORVASTATIN (LIPITOR) 10 MG TABLET    Take 1 tablet (10 mg total) by mouth daily.   CHOLECALCIFEROL (VITAMIN D3) 3000 UNITS TABS    Take 1 tablet by mouth daily.   OMEGA-3 FATTY ACIDS (FISH OIL) 1200 MG CPDR    Take 12,000 mg by mouth daily.   ONETOUCH VERIO TEST STRIP    TEST ONCE DAILY AS DIRECTED   SITAGLIPTIN-METFORMIN HCL (JANUMET XR) 50-1000 MG TB24    Take 2 tablets by mouth daily.  Modified Medications   Modified Medication Previous Medication   BICTEGRAVIR-EMTRICITABINE-TENOFOVIR AF (BIKTARVY) 50-200-25 MG TABS TABLET bictegravir-emtricitabine-tenofovir AF (BIKTARVY) 50-200-25 MG TABS tablet      Take 1 tablet by mouth daily.    Take 1 tablet by mouth daily.  Discontinued Medications   No medications on file    Subjective: Jordan Holmes is in for his HIV follow-up visit.  This is his first visit since April 2019.  He did not realize it had been so long.  He has not had any problems obtaining, taking or tolerating his Biktarvy.  He usually takes it in the evening after dinner.  He thinks he is only missed 2-3 doses since his last visit.  He has been working from home during the [employer] pandemic.  He finds it more stressful than when he is in the office.  He says that people call him all day long without regard for what time it years.  Review of Systems: Review of Systems  Constitutional: Negative for fever and  weight loss.  Respiratory: Negative for cough and shortness of breath.   Cardiovascular: Negative for chest pain.  Gastrointestinal: Negative for diarrhea, nausea and vomiting.    Past Medical History:  Diagnosis Date  . Allergy   . Bronchiectasis ([place])   . Diabetes mellitus   . HIV infection ([place])   . Hyperlipidemia   . Multiple lung nodules on CT   . Seasonal allergies     Social History   Tobacco Use  . Smoking status: Never Smoker  . Smokeless tobacco: Never Used  . Tobacco comment: Second-hand exposure through his father  Substance Use Topics  . Alcohol use: No    Alcohol/week: 0.0 standard drinks  . Drug use: No    Family History  Problem Relation Age of Onset  . Lung disease Neg Hx   . Rheumatologic disease Neg Hx   . Diabetes Neg Hx     Allergies  Allergen Reactions  . Amoxicillin Nausea And Vomiting    Fever and vomiting Diarrhea & fever    Health Maintenance  Topic Date Due  . URINE MICROALBUMIN  02/04/1972  . COLONOSCOPY  02/04/2012  . HEMOGLOBIN A1C  09/14/2018  . FOOT EXAM  03/14/2019  . OPHTHALMOLOGY EXAM  12/06/2019  . TETANUS/TDAP  11/15/2020  . INFLUENZA VACCINE  Completed  . PNEUMOCOCCAL POLYSACCHARIDE VACCINE AGE 74-64  HIGH RISK  Completed  . Hepatitis C Screening  Completed  . HIV Screening  Completed    Objective:  There were no vitals filed for this visit. There is no height or weight on file to calculate BMI.  Physical Exam Constitutional:      Comments: He is in good spirits.  Cardiovascular:     Rate and Rhythm: Normal rate and regular rhythm.     Heart sounds: No murmur.  Pulmonary:     Effort: Pulmonary effort is normal.     Breath sounds: Normal breath sounds.  Abdominal:     Palpations: Abdomen is soft.     Tenderness: There is no abdominal tenderness.  Skin:    Findings: No rash.  Psychiatric:        Mood and Affect: Mood normal.     Lab Results Lab Results  Component Value Date   WBC 5.7 04/07/2016    HGB 13.1 (L) 04/07/2016   HCT 41.9 04/07/2016   MCV 72.0 (L) 04/07/2016   PLT 306 04/07/2016    Lab Results  Component Value Date   CREATININE 1.19 04/07/2016   BUN 11 04/07/2016   NA 136 04/07/2016   K 4.2 04/07/2016   CL 102 04/07/2016   CO2 25 04/07/2016    Lab Results  Component Value Date   ALT 16 04/07/2016   AST 19 04/07/2016   ALKPHOS 84 04/07/2016   BILITOT 0.3 04/07/2016    Lab Results  Component Value Date   CHOL 153 04/07/2016   HDL 38 (L) 04/07/2016   LDLCALC 69 04/07/2016   TRIG 229 (H) 04/07/2016   CHOLHDL 4.0 04/07/2016   Lab Results  Component Value Date   LABRPR NON REAC 04/07/2016   HIV 1 RNA Quant (copies/mL)  Date Value  04/07/2016 24 (H)  06/24/2014 <20   CD4 T Cell Abs (/uL)  Date Value  04/07/2016 600  06/24/2014 450     Problem List Items Addressed This Visit      High   HIV disease (HCC)    Although he has not been to clinic in nearly 2 years his adherence with Biktarvy sounds like it has been very good.  I suspect his infection remains under very good long-term control.  He will get blood work today, continue Radio producer and follow-up in 1 year.  He has already received his influenza vaccine.      Relevant Medications   bictegravir-emtricitabine-tenofovir AF (BIKTARVY) 50-200-25 MG TABS tablet   Other Relevant Orders   T-helper cell (CD4)- ([facility] clinic only)   HIV-1 RNA quant-no reflex-bld   CBC   Comprehensive metabolic panel   RPR   Lipid panel        Cliffton Asters, MD [facility] [phone] pager   [phone] cell 01/23/2019, 3:44 PM

## 2019-01-24 LAB — T-HELPER CELL (CD4) - (RCID CLINIC ONLY)
CD4 % Helper T Cell: 27 % — ABNORMAL LOW (ref 33–65)
CD4 T Cell Abs: 632 /uL (ref 400–1790)

## 2019-01-26 LAB — LIPID PANEL
Cholesterol: 191 mg/dL (ref <200)
HDL: 35 mg/dL — ABNORMAL LOW (ref >=40)
LDL Cholesterol (Calc): 130 mg/dL (calc) — ABNORMAL HIGH
Non-HDL Cholesterol (Calc): 156 mg/dL (calc) — ABNORMAL HIGH (ref <130)
Total CHOL/HDL Ratio: 5.5 (calc) — ABNORMAL HIGH (ref <5.0)
Triglycerides: 147 mg/dL (ref <150)

## 2019-01-26 LAB — COMPREHENSIVE METABOLIC PANEL
AG Ratio: 1.5 (calc) (ref 1.0–2.5)
ALT: 19 U/L (ref 9–46)
AST: 19 U/L (ref 10–35)
Albumin: 4.5 g/dL (ref 3.6–5.1)
Alkaline phosphatase (APISO): 49 U/L (ref 35–144)
BUN: 10 mg/dL (ref 7–25)
CO2: 26 mmol/L (ref 20–32)
Calcium: 9.6 mg/dL (ref 8.6–10.3)
Chloride: 102 mmol/L (ref 98–110)
Creat: 1.29 mg/dL (ref 0.70–1.33)
Globulin: 3.1 g/dL (calc) (ref 1.9–3.7)
Glucose, Bld: 110 mg/dL — ABNORMAL HIGH (ref 65–99)
Potassium: 4.7 mmol/L (ref 3.5–5.3)
Sodium: 137 mmol/L (ref 135–146)
Total Bilirubin: 0.4 mg/dL (ref 0.2–1.2)
Total Protein: 7.6 g/dL (ref 6.1–8.1)

## 2019-01-26 LAB — CBC
HCT: 39.1 % (ref 38.5–50.0)
Hemoglobin: 11.9 g/dL — ABNORMAL LOW (ref 13.2–17.1)
MCH: 20 pg — ABNORMAL LOW (ref 27.0–33.0)
MCHC: 30.4 g/dL — ABNORMAL LOW (ref 32.0–36.0)
MCV: 65.6 fL — ABNORMAL LOW (ref 80.0–100.0)
MPV: 10.5 fL (ref 7.5–12.5)
Platelets: 372 10*3/uL (ref 140–400)
RBC: 5.96 10*6/uL — ABNORMAL HIGH (ref 4.20–5.80)
RDW: 19.3 % — ABNORMAL HIGH (ref 11.0–15.0)
WBC: 7.5 10*3/uL (ref 3.8–10.8)

## 2019-01-26 LAB — RPR: RPR Ser Ql: NONREACTIVE

## 2019-01-26 LAB — HIV-1 RNA QUANT-NO REFLEX-BLD
HIV 1 RNA Quant: <20 copies/mL
HIV-1 RNA Quant, Log: <1.3 Log copies/mL

## 2019-02-05 ENCOUNTER — Telehealth: Payer: Self-pay

## 2019-02-05 DIAGNOSIS — B20 Human immunodeficiency virus [HIV] disease: Secondary | ICD-10-CM

## 2019-02-05 MED ORDER — BICTEGRAVIR-EMTRICITAB-TENOFOV 50-200-25 MG PO TABS: 1.0000 | ORAL_TABLET | Freq: Every day | ORAL | 11 refills | Status: DC

## 2019-02-05 NOTE — Telephone Encounter (Signed)
Medication refill for [facility] with [employer].

## 2019-02-06 ENCOUNTER — Telehealth: Payer: Self-pay | Admitting: Endocrinology

## 2019-02-06 NOTE — Telephone Encounter (Signed)
Just wanting to confirm, are you wanting me to schedule this patient for a visit?

## 2019-02-06 NOTE — Telephone Encounter (Signed)
Please refer to Dr. George Hugh response below. When scheduling appt, please indicate what action will be taken should pt NS/cancels appt.

## 2019-02-06 NOTE — Telephone Encounter (Signed)
Please schedule appt.  If pt does not keep this one, no further appts.

## 2019-02-06 NOTE — Telephone Encounter (Signed)
Updated insurance information faxed to pharmacy. Jordan Holmes

## 2019-02-06 NOTE — Telephone Encounter (Signed)
Patient Is on the phone holding right now, he is requesting to be scheduled with you.  His last appointment was a "no show" and he has about 10 or 11 previous "cancelled" appointments.  Would you like me to decline scheduling?

## 2019-02-07 ENCOUNTER — Telehealth: Payer: Self-pay | Admitting: Endocrinology

## 2019-02-07 NOTE — Telephone Encounter (Signed)
na

## 2019-02-11 ENCOUNTER — Other Ambulatory Visit: Payer: Self-pay

## 2019-02-11 MED ORDER — BICTEGRAVIR-EMTRICITAB-TENOFOV 50-200-25 MG PO TABS: 1.0000 | ORAL_TABLET | Freq: Every day | ORAL | 11 refills | Status: DC

## 2019-02-11 NOTE — Addendum Note (Signed)
Addended by: Valarie Cones on: 02/11/2019 02:56 PM   Modules accepted: Orders

## 2019-02-13 ENCOUNTER — Other Ambulatory Visit: Payer: Self-pay

## 2019-02-13 ENCOUNTER — Ambulatory Visit: Payer: BC Managed Care – PPO | Admitting: Endocrinology

## 2019-02-13 ENCOUNTER — Encounter: Payer: Self-pay | Admitting: Endocrinology

## 2019-02-13 VITALS — BP 110/80 | HR 85 | Ht 65.0 in | Wt 156.6 lb

## 2019-02-13 DIAGNOSIS — E119 Type 2 diabetes mellitus without complications: Secondary | ICD-10-CM

## 2019-02-13 LAB — POCT GLYCOSYLATED HEMOGLOBIN (HGB A1C): Hemoglobin A1C: 6.6 % — AB (ref 4.0–5.6)

## 2019-02-13 MED ORDER — ONETOUCH VERIO VI STRP: 1.0000 | ORAL_STRIP | Freq: Every day | 3 refills | Status: DC

## 2019-02-13 MED ORDER — RYBELSUS 3 MG PO TABS: 3.0000 mg | ORAL_TABLET | Freq: Every day | ORAL | 11 refills | Status: DC

## 2019-02-13 MED ORDER — METFORMIN HCL ER 500 MG PO TB24: 2000.0000 mg | ORAL_TABLET | Freq: Every day | ORAL | 3 refills | Status: DC

## 2019-02-13 MED ORDER — METFORMIN HCL ER 500 MG PO TB24: 500.0000 mg | ORAL_TABLET | Freq: Every day | ORAL | 3 refills | Status: DC

## 2019-02-13 NOTE — Progress Notes (Signed)
Subjective:    Patient ID: Jordan Holmes, male    DOB: [birth year], 59 y.o.   MRN: [id number]  HPI Pt returns for f/u of diabetes mellitus:  DM type: 2 (but he may be developing type 1--lean body habitus and neg FHx).  Dx'ed: 4970 Complications: PN and renal insuff Therapy: janumet DKA: never Severe hypoglycemia: never.  Pancreatitis: never Pancreatic imaging: never Other: pioglitizone is considered, due to hypertrigliceridemia.   Interval history: He brings his meter with his cbg's which I have reviewed today.  cbg varies from 88-140.  pt states he feels well in general.  Ins no longer covers Janumet.   Past Medical History:  Diagnosis Date  . Allergy   . Bronchiectasis ([place])   . Diabetes mellitus   . HIV infection ([place])   . Hyperlipidemia   . Multiple lung nodules on CT   . Seasonal allergies     Past Surgical History:  Procedure Laterality Date  . VIDEO BRONCHOSCOPY Bilateral 10/02/2015   Procedure: VIDEO BRONCHOSCOPY WITH FLUORO;  Surgeon: Javier Glazier, MD;  Location: [facility];  Service: Cardiopulmonary;  Laterality: Bilateral;    Social History   Socioeconomic History  . Marital status: Married    Spouse name: Not on file  . Number of children: 2  . Years of education: Not on file  . Highest education level: Not on file  Occupational History  . Occupation: IT  Tobacco Use  . Smoking status: Never Smoker  . Smokeless tobacco: Never Used  . Tobacco comment: Second-hand exposure through his father  Substance and Sexual Activity  . Alcohol use: No    Alcohol/week: 0.0 standard drinks  . Drug use: No  . Sexual activity: Yes    Partners: Female    Birth control/protection: Condom    Comment: declined condoms  Other Topics Concern  . Not on file  Social History Narrative   Originally from [place]. He move to the [place] in 2005. He has also lived in [place], [place], and also [place]. He has traveled all over the [place]. No pets currently. No bird exposure. No  mold or hot tub exposure. He currently works in Engineer, technical sales. Enjoys playing tennis & walking.   Social Determinants of Health   Financial Resource Strain:   . Difficulty of Paying Living Expenses: Not on file  Food Insecurity:   . Worried About Charity fundraiser in the Last Year: Not on file  . Ran Out of Food in the Last Year: Not on file  Transportation Needs:   . Lack of Transportation (Medical): Not on file  . Lack of Transportation (Non-Medical): Not on file  Physical Activity:   . Days of Exercise per Week: Not on file  . Minutes of Exercise per Session: Not on file  Stress:   . Feeling of Stress : Not on file  Social Connections:   . Frequency of Communication with Friends and Family: Not on file  . Frequency of Social Gatherings with Friends and Family: Not on file  . Attends Religious Services: Not on file  . Active Member of Clubs or Organizations: Not on file  . Attends Archivist Meetings: Not on file  . Marital Status: Not on file  Intimate Partner Violence:   . Fear of Current or Ex-Partner: Not on file  . Emotionally Abused: Not on file  . Physically Abused: Not on file  . Sexually Abused: Not on file    Current Outpatient Medications on File Prior  to Visit  Medication Sig Dispense Refill  . atorvastatin (LIPITOR) 10 MG tablet Take 1 tablet (10 mg total) by mouth daily. 90 tablet 3  . bictegravir-emtricitabine-tenofovir AF (BIKTARVY) 50-200-25 MG TABS tablet Take 1 tablet by mouth daily. 30 tablet 11  . Cholecalciferol (VITAMIN D3) 3000 units TABS Take 1 tablet by mouth daily.    . Omega-3 Fatty Acids (FISH OIL) 1200 MG CPDR Take 12,000 mg by mouth daily.     No current facility-administered medications on file prior to visit.    Allergies  Allergen Reactions  . Amoxicillin Nausea And Vomiting    Fever and vomiting Diarrhea & fever    Family History  Problem Relation Age of Onset  . Lung disease Neg Hx   . Rheumatologic disease Neg Hx   . Diabetes  Neg Hx     BP 110/80 (BP Location: Left Arm, Patient Position: Sitting, Cuff Size: Normal)   Pulse 85   Ht 5\' 5"  (1.651 m)   Wt 156 lb 9.6 oz (71 kg)   SpO2 98%   BMI 26.06 kg/m    Review of Systems Denies nausea.      Objective:   Physical Exam VITAL SIGNS:  See vs page GENERAL: no distress Pulses: dorsalis pedis intact bilat.   MSK: no deformity of the feet CV: no leg edema Skin:  no ulcer on the feet.  normal color and temp on the feet. Neuro: sensation is intact to touch on the feet.   Lab Results  Component Value Date   HGBA1C 6.6 (A) 02/13/2019    Lab Results  Component Value Date   CREATININE 1.29 01/23/2019   BUN 10 01/23/2019   NA 137 01/23/2019   K 4.7 01/23/2019   CL 102 01/23/2019   CO2 26 01/23/2019      Assessment & Plan:  Type 2 DM, with PN: he would benefit from increased rx, if it can be done with a regimen that avoids or minimizes hypoglycemia.   Renal insuff: This limits rx options.    Patient Instructions  I have sent a prescription to your pharmacy, to change Janumet to Rybelsus and metformin.  We'll do the prior authorization if necessary.   If this is not covered, we'll change to alogliptin+metformin.   check your blood sugar once a day.  vary the time of day when you check, between before the 3 meals, and at bedtime.  also check if you have symptoms of your blood sugar being too high or too low.  please keep a record of the readings and bring it to your next appointment here (or you can bring the meter itself).  You can write it on any piece of paper.  please call 01/25/2019 sooner if your blood sugar goes below 70, or if you have a lot of readings over 200.  Please come back for a follow-up appointment in 3 months.

## 2019-02-13 NOTE — Patient Instructions (Addendum)
I have sent a prescription to your pharmacy, to change Janumet to Rybelsus and metformin.  We'll do the prior authorization if necessary.   If this is not covered, we'll change to alogliptin+metformin.   check your blood sugar once a day.  vary the time of day when you check, between before the 3 meals, and at bedtime.  also check if you have symptoms of your blood sugar being too high or too low.  please keep a record of the readings and bring it to your next appointment here (or you can bring the meter itself).  You can write it on any piece of paper.  please call us sooner if your blood sugar goes below 70, or if you have a lot of readings over 200.  Please come back for a follow-up appointment in 3 months.

## 2019-02-14 ENCOUNTER — Telehealth: Payer: Self-pay | Admitting: Endocrinology

## 2019-02-14 NOTE — Telephone Encounter (Signed)
please contact patient: Ins declined rybelsus.  Next option is "trulicity" (once a week injection).  OK with you?  If so, I will send a prescription to your pharmacy.  Please let me know if you need to see CDE, to learn how to use the pen.

## 2019-02-14 NOTE — Telephone Encounter (Signed)
Called pt and made him aware. States he will check with his insurance company to determine if this is a covered alternative. States he will call us back if covered by his plan. Will await his returned call.

## 2019-02-18 ENCOUNTER — Telehealth: Payer: Self-pay

## 2019-02-18 NOTE — Telephone Encounter (Signed)
Received call from Pharmacy stating Jordan Holmes is not cover by insurnace and is also not requesting PA.   Called insurance to confirm if PA was needed.   PA obtained and approved through 02/18/19-02/18/20.  Pharmacy called and made aware of PA approval.  Valarie Cones

## 2019-02-19 ENCOUNTER — Ambulatory Visit: Payer: Self-pay | Admitting: Internal Medicine

## 2019-03-01 ENCOUNTER — Telehealth: Payer: Self-pay | Admitting: Endocrinology

## 2019-03-01 NOTE — Telephone Encounter (Signed)
MEDICATION: alogliptin  PHARMACY:   [id number] [place], [place] - [address] Phone:  [phone]  Fax:  [phone]      IS THIS A 90 DAY SUPPLY : yes  IS PATIENT OUT OF MEDICATION: no  IF NOT; HOW MUCH IS LEFT: 3  LAST APPOINTMENT DATE: 02/13/2019  NEXT APPOINTMENT DATE: N/A  DO WE HAVE YOUR PERMISSION TO LEAVE A DETAILED MESSAGE: yes  OTHER COMMENTS:    **Let patient know to contact pharmacy at the end of the day to make sure medication is ready. **  ** Please notify patient to allow 48-72 hours to process**  **Encourage patient to contact the pharmacy for refills or they can request refills through [facility]**

## 2019-03-04 MED ORDER — ALOGLIPTIN BENZOATE 25 MG PO TABS: 25.0000 mg | ORAL_TABLET | Freq: Every day | ORAL | 3 refills | Status: DC

## 2019-03-04 NOTE — Telephone Encounter (Signed)
Ok, I have sent a prescription to your pharmacy.  This replaces [place].

## 2019-03-04 NOTE — Telephone Encounter (Signed)
Outpatient Medication Detail   Disp Refills Start End   Alogliptin Benzoate 25 MG TABS 90 tablet 3 03/04/2019    Sig - Route: Take 25 mg by mouth daily. - Oral   Sent to pharmacy as: Alogliptin Benzoate 25 MG Tab   E-Prescribing Status: Receipt confirmed by pharmacy (03/04/2019 12:17 PM EST)

## 2019-03-04 NOTE — Telephone Encounter (Signed)
Please advise. I am not seeing this specific Rx on his medication list

## 2019-03-05 ENCOUNTER — Telehealth: Payer: Self-pay | Admitting: Endocrinology

## 2019-03-05 NOTE — Telephone Encounter (Signed)
The -XR refers to the metformin.  The alogliptin replaces the Januvia.

## 2019-03-05 NOTE — Telephone Encounter (Signed)
Given the confusion about this, I need to hear from the patient that he will be taking metformin and another medication.  Is this right?

## 2019-03-05 NOTE — Telephone Encounter (Signed)
In addition to the Alogliptin? Pt called yesterday to request this refill

## 2019-03-05 NOTE — Telephone Encounter (Signed)
Per pt comment below, pt states he was never taking Januvia

## 2019-03-05 NOTE — Telephone Encounter (Signed)
Ok, so please take just the metformin-XR

## 2019-03-05 NOTE — Telephone Encounter (Signed)
Per Dr. Everardo All, pt will ONLY take Metformin XR AND NOT in addition to alogliptin which will be d/c'd per Dr. George Hugh orders. Per Dr. Everardo All, he only refilled alogliptin IF replacing Januvia. Since pt is NOT taking Januvia, alogliptin is NOT being added to pt regimen. He is ONLY to take Metformin XR. LVM requesting pt to return my call.

## 2019-03-05 NOTE — Telephone Encounter (Signed)
Office note 02/13/19 - I have sent a prescription to your pharmacy, to change Janumet to Rybelsus and metformin.  We'll do the prior authorization if necessary.   If this is not covered, we'll change to alogliptin+metformin.    Telephone encounter 02/14/19 - please contact patient: Ins declined rybelsus.  Next option is "trulicity" (once a week injection).   After speaking to pt, he DID call insurance company, and they are not covering either Rybelsus or Trulicity. According to this plan, medications that are covered are Combiglyz and Alogliptin. According to your notes, to replace Janumet, you would add BOTH metformin and alogliptin. Please advise

## 2019-03-05 NOTE — Telephone Encounter (Signed)
Patient called to see if RX was called in - I told him Alogliptin Benzoate was called in yesterday replacing [place], patient says he was never taking [place] and also the new RX should be XR. Patient requests nurse to call him back at ph# [phone]

## 2019-03-05 NOTE — Telephone Encounter (Signed)
No, not in addition

## 2019-03-05 NOTE — Telephone Encounter (Signed)
Please refer to pt comment below and advise

## 2019-03-06 NOTE — Telephone Encounter (Signed)
Called pt and informed about Dr. George Hugh recommendation below - begin alogliptin WITH metformin XR. F/u appt scheduled 06/06/19. Pt verbalized acceptance and understanding of all information provided.

## 2019-03-06 NOTE — Telephone Encounter (Signed)
According to the pt, he is wanting to take metformin AND alogliptin. Orders were placed for alogliptin. Question NOW is should metformin be XR or just metformin WITHOUT extended release?

## 2019-03-06 NOTE — Telephone Encounter (Signed)
OK, we are finally done.  -XR please.

## 2019-04-25 ENCOUNTER — Other Ambulatory Visit: Payer: Self-pay

## 2019-04-25 ENCOUNTER — Telehealth: Payer: Self-pay

## 2019-04-25 ENCOUNTER — Telehealth: Payer: Self-pay | Admitting: Pharmacy Technician

## 2019-04-25 DIAGNOSIS — B20 Human immunodeficiency virus [HIV] disease: Secondary | ICD-10-CM

## 2019-04-25 MED ORDER — BICTEGRAVIR-EMTRICITAB-TENOFOV 50-200-25 MG PO TABS: 1.0000 | ORAL_TABLET | Freq: Every day | ORAL | 3 refills | Status: DC

## 2019-04-25 NOTE — Telephone Encounter (Addendum)
[facility] Patient Advocate Encounter   Was successful in obtaining a [place] copay card for [employer].  This copay card will make the patients copay $0.  I have spoken with the patient.    The billing information is as follows and has been shared with [facility].  UPDATE:  He must use his preferred Specialty pharmacy and the prescription and copay card were transferred there.  NPI [id number]  RxBin: [id number] PCN: [id number] Member ID: [id number] Group ID: [id number]  Kathie Rhodes E. Dimas Aguas CPhT Specialty Pharmacy Patient [facility] Phone: [phone] Fax:  [phone]

## 2019-04-25 NOTE — Telephone Encounter (Signed)
Patient called office today stating he is having issues getting medication. States he is being charged for medication refill and cannot afford it.  Per chart PA was done on 02/18/19 which is good through 02/18/20. [facility] medication is ready to pick up. Has a high copay cost even with PA and manufacture card. Per Kathie Rhodes, Pharmacy Tech new copay card will be called to the patient; should have 0 copay once [facility] receives this info. Jordan Holmes, [place]

## 2019-06-04 ENCOUNTER — Telehealth: Payer: Self-pay | Admitting: Endocrinology

## 2019-06-04 NOTE — Telephone Encounter (Signed)
Called pt to obtain CBG's. Pt states he does not have any readings on his meter EXCEPT for 05/11/19. Advised pt of the importance of providing Dr. Everardo All with current CBG readings that include the date, time and result so he can better determine how to properly adjust medication. Advised, going forward, he must write down his CBG's with date, time and result so we can provide Dr. Everardo All with information that will help him to determine how best to treat his concerns. Since he is not able to provide this information, advised I will forward this message for him to review and address.

## 2019-06-04 NOTE — Telephone Encounter (Signed)
Patient said his blood sugars have been high since he started taking the metformin and alogliptin. Patient ph# [phone]

## 2019-06-04 NOTE — Telephone Encounter (Signed)
Called pt and informed about Dr. George Hugh response below. Offered for pt to be seen tomorrow but declined my offer. Advised with Dr. Everardo All being unavailable for 2 weeks after this Friday, he is not able to address without a current appt. Pt declined to move appt and states he will address at the end of the month.

## 2019-06-04 NOTE — Telephone Encounter (Signed)
Pt has canceled 06/06/19 appt, in favor of a phone message.  Please move up next appt to next available.

## 2019-06-06 ENCOUNTER — Ambulatory Visit: Payer: BC Managed Care – PPO | Admitting: Endocrinology

## 2019-06-20 ENCOUNTER — Telehealth: Payer: Self-pay | Admitting: Pharmacist

## 2019-06-20 ENCOUNTER — Telehealth: Payer: Self-pay | Admitting: Pharmacy Technician

## 2019-06-20 NOTE — Telephone Encounter (Signed)
Unfortunately the patients current insurance will not pay for any HIV medications and he has gone through all of the money on his copay card for [place]. I spoke with Dr. Orvan Falconer and we will change the patient to Symtuza to finish out the year until he can change his insurance. We will provide the patient with samples.

## 2019-06-20 NOTE — Telephone Encounter (Signed)
[facility] Patient Advocate Encounter  Patient called because he has used up $7200 of [place] copay assistance for [employer].  He is over income for PAF assistance ($100,000, 4 in the household) and [place] is for Medicare insured patient's only.  He reached out to [place] in the past and they will not offer any assistance beyond the $7200.  He is calling his insurance to see what is left of his deductible and will pay it off.  Hopefully the medication will be paid in full after that.  Netty Starring. Dimas Aguas CPhT Specialty Pharmacy Patient [facility] Phone: [phone] Fax:  [phone]

## 2019-06-20 NOTE — Telephone Encounter (Signed)
[facility] Patient Advocate Encounter  He or his wife will pick up Symtuza tomorrow or Monday.  The medication is store in pharmacy at [facility].  Netty Starring. Dimas Aguas CPhT Specialty Pharmacy Patient [facility] for [facility] Disease Phone: [phone] Fax:  [phone]

## 2019-06-21 NOTE — Telephone Encounter (Signed)
[facility] Patient Advocate Encounter  Patient picked up Symtuza medication this morning.

## 2019-07-02 ENCOUNTER — Other Ambulatory Visit: Payer: Self-pay

## 2019-07-02 ENCOUNTER — Ambulatory Visit (INDEPENDENT_AMBULATORY_CARE_PROVIDER_SITE_OTHER): Payer: BC Managed Care – PPO | Admitting: Endocrinology

## 2019-07-02 ENCOUNTER — Encounter: Payer: Self-pay | Admitting: Endocrinology

## 2019-07-02 VITALS — BP 140/80 | HR 90 | Ht 65.0 in | Wt 157.2 lb

## 2019-07-02 DIAGNOSIS — E119 Type 2 diabetes mellitus without complications: Secondary | ICD-10-CM

## 2019-07-02 LAB — POCT GLYCOSYLATED HEMOGLOBIN (HGB A1C): Hemoglobin A1C: 7.3 % — AB (ref 4.0–5.6)

## 2019-07-02 MED ORDER — DAPAGLIFLOZIN PROPANEDIOL 5 MG PO TABS: 5.0000 mg | ORAL_TABLET | Freq: Every day | ORAL | 11 refills | Status: DC

## 2019-07-02 NOTE — Progress Notes (Signed)
Subjective:    Patient ID: Jordan Holmes, male    DOB: 01/26/1960, 59 y.o.   MRN: [id number]  HPI Pt returns for f/u of diabetes mellitus:  DM type: 2 (but he may be developing type 1--lean body habitus and neg FHx).  Dx'ed: 2010 Complications: PN Therapy: 2 oral meds DKA: never Severe hypoglycemia: never.  Pancreatitis: never Pancreatic imaging: never Other: pioglitizone is considered, due to hypertrigliceridemia; ins declines Rybelsus Interval history: He brings his meter with his cbg's which I have reviewed today.  cbg's vary from 101-181.  pt states weight gain.   Past Medical History:  Diagnosis Date  . Allergy   . Bronchiectasis (HCC)   . Diabetes mellitus   . HIV infection (HCC)   . Hyperlipidemia   . Multiple lung nodules on CT   . Seasonal allergies     Past Surgical History:  Procedure Laterality Date  . VIDEO BRONCHOSCOPY Bilateral 10/02/2015   Procedure: VIDEO BRONCHOSCOPY WITH FLUORO;  Surgeon: Roslynn Amble, MD;  Location: [facility] ENDOSCOPY;  Service: Cardiopulmonary;  Laterality: Bilateral;    Social History   Socioeconomic History  . Marital status: Married    Spouse name: Not on file  . Number of children: 2  . Years of education: Not on file  . Highest education level: Not on file  Occupational History  . Occupation: IT  Tobacco Use  . Smoking status: Never Smoker  . Smokeless tobacco: Never Used  . Tobacco comment: Second-hand exposure through his father  Substance and Sexual Activity  . Alcohol use: No    Alcohol/week: 0.0 standard drinks  . Drug use: No  . Sexual activity: Yes    Partners: Female    Birth control/protection: Condom    Comment: declined condoms  Other Topics Concern  . Not on file  Social History Narrative   Originally from [place]. He move to the [place] in 2005. He has also lived in [place], [place], and also [place]. He has traveled all over the [place]. No pets currently. No bird exposure. No mold or hot tub exposure. He  currently works in Consulting civil engineer. Enjoys playing tennis & walking.   Social Determinants of Health   Financial Resource Strain:   . Difficulty of Paying Living Expenses:   Food Insecurity:   . Worried About Programme researcher, broadcasting/film/video in the Last Year:   . Barista in the Last Year:   Transportation Needs:   . Freight forwarder (Medical):   Marland Kitchen Lack of Transportation (Non-Medical):   Physical Activity:   . Days of Exercise per Week:   . Minutes of Exercise per Session:   Stress:   . Feeling of Stress :   Social Connections:   . Frequency of Communication with Friends and Family:   . Frequency of Social Gatherings with Friends and Family:   . Attends Religious Services:   . Active Member of Clubs or Organizations:   . Attends Banker Meetings:   Marland Kitchen Marital Status:   Intimate Partner Violence:   . Fear of Current or Ex-Partner:   . Emotionally Abused:   Marland Kitchen Physically Abused:   . Sexually Abused:     Current Outpatient Medications on File Prior to Visit  Medication Sig Dispense Refill  . Alogliptin Benzoate 25 MG TABS Take 25 mg by mouth daily. 90 tablet 3  . atorvastatin (LIPITOR) 10 MG tablet Take 1 tablet (10 mg total) by mouth daily. 90 tablet 3  . bictegravir-emtricitabine-tenofovir  AF (BIKTARVY) 50-200-25 MG TABS tablet Take 1 tablet by mouth daily. 30 tablet 3  . Cholecalciferol (VITAMIN D3) 3000 units TABS Take 1 tablet by mouth daily.    Marland Kitchen glucose blood (ONETOUCH VERIO) test strip 1 each by Other route daily. And lancets 1/day 100 each 3  . metFORMIN (GLUCOPHAGE-XR) 500 MG 24 hr tablet Take 1 tablet (500 mg total) by mouth daily. 90 tablet 3  . Omega-3 Fatty Acids (FISH OIL) 1200 MG CPDR Take 12,000 mg by mouth daily.     No current facility-administered medications on file prior to visit.    Allergies  Allergen Reactions  . Amoxicillin Nausea And Vomiting    Fever and vomiting Diarrhea & fever    Family History  Problem Relation Age of Onset  . Lung  disease Neg Hx   . Rheumatologic disease Neg Hx   . Diabetes Neg Hx     BP 140/80   Pulse 90   Ht 5\' 5"  (1.651 m)   Wt 157 lb 3.2 oz (71.3 kg)   SpO2 100%   BMI 26.16 kg/m    Review of Systems Denies nausea.  He says he has slight swelling of the feet.      Objective:   Physical Exam VITAL SIGNS:  See vs page GENERAL: no distress Pulses: dorsalis pedis intact bilat.   MSK: no deformity of the feet CV: no leg edema Skin:  no ulcer on the feet.  normal color and temp on the feet. Neuro: sensation is intact to touch on the feet.    Lab Results  Component Value Date   HGBA1C 7.3 (A) 07/02/2019   Lab Results  Component Value Date   CREATININE 1.29 01/23/2019   BUN 10 01/23/2019   NA 137 01/23/2019   K 4.7 01/23/2019   CL 102 01/23/2019   CO2 26 01/23/2019       Assessment & Plan:  Type 2 DM, with PN: worse.   HTN: is noted today  Patient Instructions  I have sent a prescription to your pharmacy, to add "01/25/2019."  Please continue the same other diabetes medications. check your blood sugar once a day.  vary the time of day when you check, between before the 3 meals, and at bedtime.  also check if you have symptoms of your blood sugar being too high or too low.  please keep a record of the readings and bring it to your next appointment here (or you can bring the meter itself).  You can write it on any piece of paper.  please call Marcelline Deist sooner if your blood sugar goes below 70, or if you have a lot of readings over 200. If insurance does not cover this, we'll change to "repaglinide" with supper.   Your blood pressure is high today.  Please see your primary care provider soon, to have it rechecked.   Please come back for a follow-up appointment in 2 months.

## 2019-07-02 NOTE — Patient Instructions (Addendum)
I have sent a prescription to your pharmacy, to add "Marcelline Deist."  Please continue the same other diabetes medications. check your blood sugar once a day.  vary the time of day when you check, between before the 3 meals, and at bedtime.  also check if you have symptoms of your blood sugar being too high or too low.  please keep a record of the readings and bring it to your next appointment here (or you can bring the meter itself).  You can write it on any piece of paper.  please call us sooner if your blood sugar goes below 70, or if you have a lot of readings over 200. If insurance does not cover this, we'll change to "repaglinide" with supper.   Your blood pressure is high today.  Please see your primary care provider soon, to have it rechecked.   Please come back for a follow-up appointment in 2 months.

## 2019-07-24 ENCOUNTER — Telehealth: Payer: Self-pay | Admitting: Endocrinology

## 2019-07-24 ENCOUNTER — Other Ambulatory Visit: Payer: Self-pay | Admitting: Endocrinology

## 2019-07-24 MED ORDER — REPAGLINIDE 1 MG PO TABS: 1.0000 mg | ORAL_TABLET | Freq: Every day | ORAL | 11 refills | Status: DC

## 2019-07-24 NOTE — Telephone Encounter (Signed)
OK, I have sent a prescription to your pharmacy, to change Farxiga to "repaglinide," with supper only.  I'll see you next time.

## 2019-07-24 NOTE — Telephone Encounter (Signed)
Please advise 

## 2019-07-24 NOTE — Telephone Encounter (Signed)
Called patient to reschedule appointment. Patient stated that patient's insurance does not cover dapagliflozin propanediol (FARXIGA) 5 MG TABS tablet also, the $0 Copay card did not work.  Also Patient requests to be called at ph# [phone] re: Patient states his medications (Alogliptin Benzoate 25 MG TABS and metFORMIN (GLUCOPHAGE-XR) 500 MG 24 hr tablet) do not seem to be working.

## 2019-09-19 DIAGNOSIS — E119 Type 2 diabetes mellitus without complications: Secondary | ICD-10-CM | POA: Diagnosis not present

## 2019-09-19 LAB — HM DIABETES EYE EXAM

## 2019-10-01 ENCOUNTER — Ambulatory Visit: Payer: BC Managed Care – PPO | Admitting: Endocrinology

## 2019-10-02 NOTE — Telephone Encounter (Signed)
error 

## 2019-10-09 ENCOUNTER — Ambulatory Visit: Payer: BC Managed Care – PPO | Admitting: Endocrinology

## 2019-10-11 DIAGNOSIS — E119 Type 2 diabetes mellitus without complications: Secondary | ICD-10-CM | POA: Diagnosis not present

## 2019-10-11 DIAGNOSIS — Z0000 Encounter for general adult medical examination without abnormal findings: Secondary | ICD-10-CM | POA: Diagnosis not present

## 2019-10-11 DIAGNOSIS — E7800 Pure hypercholesterolemia, unspecified: Secondary | ICD-10-CM | POA: Diagnosis not present

## 2019-10-11 DIAGNOSIS — Z125 Encounter for screening for malignant neoplasm of prostate: Secondary | ICD-10-CM | POA: Diagnosis not present

## 2020-01-22 ENCOUNTER — Telehealth: Payer: Self-pay

## 2020-01-22 NOTE — Telephone Encounter (Signed)
Received PA for [employer] from [place]. Initiated PA via covermymeds. Waiting on outcome. Called patient and left voicemail requesting call back for appointments. Patient is overdue for labs and follow up.

## 2020-01-27 NOTE — Telephone Encounter (Signed)
Received fax from [facility] stating PA was denied due to medical necessity. Per fax provider can attempt appeal within 180 days of denial.  Spoke with Georgiann Hahn at [facility] speciality pharmacy who states primary insurance is approving medication, but with high copay. Pharmacy will work with copay assistance program to help with copay amount. Will call office with update.  Clinical Reviewer for appeal if needed. P: [phone].

## 2020-01-27 NOTE — Telephone Encounter (Signed)
Jordan Holmes with speciality pharmacy was able to call office back regarding copay assistance. Patient will have a $0 copay. Pharmacy understands patient must scheduled appointment for additional refills.Pharamcy will dispense 30 day supply.  [name], [place]

## 2020-01-28 ENCOUNTER — Ambulatory Visit: Payer: BC Managed Care – PPO | Admitting: Endocrinology

## 2020-02-07 ENCOUNTER — Other Ambulatory Visit: Payer: Self-pay | Admitting: Endocrinology

## 2020-02-10 ENCOUNTER — Other Ambulatory Visit: Payer: Self-pay | Admitting: Internal Medicine

## 2020-02-10 DIAGNOSIS — B20 Human immunodeficiency virus [HIV] disease: Secondary | ICD-10-CM

## 2020-02-25 ENCOUNTER — Other Ambulatory Visit: Payer: Self-pay

## 2020-02-25 ENCOUNTER — Ambulatory Visit (INDEPENDENT_AMBULATORY_CARE_PROVIDER_SITE_OTHER): Payer: BC Managed Care – PPO | Admitting: Internal Medicine

## 2020-02-25 ENCOUNTER — Encounter: Payer: Self-pay | Admitting: Internal Medicine

## 2020-02-25 DIAGNOSIS — B20 Human immunodeficiency virus [HIV] disease: Secondary | ICD-10-CM | POA: Diagnosis not present

## 2020-02-25 MED ORDER — BIKTARVY 50-200-25 MG PO TABS
ORAL_TABLET | ORAL | 11 refills | Status: DC
Start: 2020-02-25 — End: 2020-03-31

## 2020-02-25 NOTE — Progress Notes (Signed)
Patient Active Problem List   Diagnosis Date Noted  . HIV disease (HCC) 07/20/2014    Priority: High  . Nocturnal emission 10/04/2016  . Constipation 04/07/2016  . Penile pain 04/07/2016  . Depression 04/07/2016  . Lung nodules   . Multiple lung nodules on CT 08/14/2014  . Bronchiectasis without acute exacerbation (HCC) 08/14/2014  . Wheezing 08/14/2014  . Seasonal allergies 07/20/2014  . Hypertriglyceridemia 11/16/2010  . Diabetes mellitus, type 2 (HCC) 11/16/2010    Patient's Medications  New Prescriptions   No medications on file  Previous Medications   ALOGLIPTIN BENZOATE 25 MG TABS    Take 25 mg by mouth daily.   ATORVASTATIN (LIPITOR) 10 MG TABLET    Take 1 tablet (10 mg total) by mouth daily.   CHOLECALCIFEROL (VITAMIN D3) 3000 UNITS TABS    Take 1 tablet by mouth daily.   GLUCOSE BLOOD (ONETOUCH VERIO) TEST STRIP    1 each by Other route daily. And lancets 1/day   METFORMIN (GLUCOPHAGE-XR) 500 MG 24 HR TABLET    TAKE 1 TABLET BY MOUTH EVERY DAY   OMEGA-3 FATTY ACIDS (FISH OIL) 1200 MG CPDR    Take 12,000 mg by mouth daily.   REPAGLINIDE (PRANDIN) 1 MG TABLET    Take 1 tablet (1 mg total) by mouth daily with supper.  Modified Medications   Modified Medication Previous Medication   BICTEGRAVIR-EMTRICITABINE-TENOFOVIR AF (BIKTARVY) 50-200-25 MG TABS TABLET BIKTARVY 50-200-25 MG TABS tablet      TAKE 1 TABLET BY MOUTH 1 TIME A DAY.    TAKE 1 TABLET BY MOUTH 1 TIME A DAY.  Discontinued Medications   No medications on file    Subjective: Jordan Holmes is in for his routine HIV follow-up visit.  Last year he used up his [place] co-pay by June and could not afford his Biktarvy.  Fortunately our pharmacy staff was able to get him a supply of Symtuza.  On January 1 he changed back to [place].  He rarely misses doses.  He has received his Covid booster and influenza vaccine.  He is feeling well.  He is still working from home.  Review of Systems: Review of Systems   Constitutional: Negative for fever and weight loss.  Psychiatric/Behavioral: Negative for depression.    Past Medical History:  Diagnosis Date  . Allergy   . Bronchiectasis (HCC)   . Diabetes mellitus   . HIV infection (HCC)   . Hyperlipidemia   . Multiple lung nodules on CT   . Seasonal allergies     Social History   Tobacco Use  . Smoking status: Never Smoker  . Smokeless tobacco: Never Used  . Tobacco comment: Second-hand exposure through his father  Substance Use Topics  . Alcohol use: No    Alcohol/week: 0.0 standard drinks  . Drug use: No    Family History  Problem Relation Age of Onset  . Lung disease Neg Hx   . Rheumatologic disease Neg Hx   . Diabetes Neg Hx     Allergies  Allergen Reactions  . Amoxicillin Nausea And Vomiting    Fever and vomiting Diarrhea & fever    Health Maintenance  Topic Date Due  . URINE MICROALBUMIN  Never done  . COLONOSCOPY (Pts 45-77yrs Insurance coverage will need to be confirmed)  Never done  . HEMOGLOBIN A1C  01/01/2020  . COVID-19 Vaccine (4 - Booster for Pfizer series) 04/14/2020  . FOOT EXAM  07/01/2020  .  OPHTHALMOLOGY EXAM  09/18/2020  . TETANUS/TDAP  08/24/2027  . INFLUENZA VACCINE  Completed  . PNEUMOCOCCAL POLYSACCHARIDE VACCINE AGE 4-64 HIGH RISK  Completed  . Hepatitis C Screening  Completed  . HIV Screening  Completed    Objective:  Vitals:   02/25/20 1511  BP: 135/79  Pulse: 83  Temp: 97.9 F (36.6 C)  TempSrc: Oral  SpO2: 99%  Weight: 158 lb (71.7 kg)   Body mass index is 26.29 kg/m.  Physical Exam Constitutional:      Comments: He is in good spirits.  Cardiovascular:     Rate and Rhythm: Normal rate.  Pulmonary:     Effort: Pulmonary effort is normal.  Psychiatric:        Mood and Affect: Mood normal.     Lab Results Lab Results  Component Value Date   WBC 7.5 01/23/2019   HGB 11.9 (L) 01/23/2019   HCT 39.1 01/23/2019   MCV 65.6 (L) 01/23/2019   PLT 372 01/23/2019    Lab  Results  Component Value Date   CREATININE 1.29 01/23/2019   BUN 10 01/23/2019   NA 137 01/23/2019   K 4.7 01/23/2019   CL 102 01/23/2019   CO2 26 01/23/2019    Lab Results  Component Value Date   ALT 19 01/23/2019   AST 19 01/23/2019   ALKPHOS 84 04/07/2016   BILITOT 0.4 01/23/2019    Lab Results  Component Value Date   CHOL 191 01/23/2019   HDL 35 (L) 01/23/2019   LDLCALC 130 (H) 01/23/2019   TRIG 147 01/23/2019   CHOLHDL 5.5 (H) 01/23/2019   Lab Results  Component Value Date   LABRPR NON-REACTIVE 01/23/2019   HIV 1 RNA Quant (copies/mL)  Date Value  01/23/2019 <20 NOT DETECTED  04/07/2016 24 (H)  06/24/2014 <20   CD4 T Cell Abs (/uL)  Date Value  01/23/2019 632  04/07/2016 600  06/24/2014 450     Problem List Items Addressed This Visit      High   HIV disease (HCC)    His infection has been under excellent, long-term control.  He will continue Biktarvy, get blood work today and follow-up in 1 year.        Relevant Medications   bictegravir-emtricitabine-tenofovir AF (BIKTARVY) 50-200-25 MG TABS tablet   Other Relevant Orders   T-helper cell (CD4)- ([facility] clinic only)   HIV-1 RNA quant-no reflex-bld   CBC   Comprehensive metabolic panel   RPR   Lipid panel        Cliffton Asters, MD [facility] [phone] pager   [phone] cell 02/25/2020, 3:35 PM

## 2020-02-25 NOTE — Assessment & Plan Note (Signed)
His infection has been under excellent, long-term control.  He will continue Biktarvy, get blood work today and follow-up in 1 year.

## 2020-02-26 LAB — T-HELPER CELL (CD4) - (RCID CLINIC ONLY)
CD4 % Helper T Cell: 27 % — ABNORMAL LOW (ref 33–65)
CD4 T Cell Abs: 606 /uL (ref 400–1790)

## 2020-02-27 LAB — COMPREHENSIVE METABOLIC PANEL
AG Ratio: 1.5 (calc) (ref 1.0–2.5)
ALT: 20 U/L (ref 9–46)
AST: 20 U/L (ref 10–35)
Albumin: 4.4 g/dL (ref 3.6–5.1)
Alkaline phosphatase (APISO): 49 U/L (ref 35–144)
BUN/Creatinine Ratio: 10 (calc) (ref 6–22)
BUN: 13 mg/dL (ref 7–25)
CO2: 26 mmol/L (ref 20–32)
Calcium: 9 mg/dL (ref 8.6–10.3)
Chloride: 104 mmol/L (ref 98–110)
Creat: 1.36 mg/dL — ABNORMAL HIGH (ref 0.70–1.33)
Globulin: 3 g/dL (calc) (ref 1.9–3.7)
Glucose, Bld: 149 mg/dL — ABNORMAL HIGH (ref 65–99)
Potassium: 3.9 mmol/L (ref 3.5–5.3)
Sodium: 137 mmol/L (ref 135–146)
Total Bilirubin: 0.6 mg/dL (ref 0.2–1.2)
Total Protein: 7.4 g/dL (ref 6.1–8.1)

## 2020-02-27 LAB — CBC
HCT: 41.4 % (ref 38.5–50.0)
Hemoglobin: 12.6 g/dL — ABNORMAL LOW (ref 13.2–17.1)
MCH: 20.2 pg — ABNORMAL LOW (ref 27.0–33.0)
MCHC: 30.4 g/dL — ABNORMAL LOW (ref 32.0–36.0)
MCV: 66.5 fL — ABNORMAL LOW (ref 80.0–100.0)
MPV: 10.9 fL (ref 7.5–12.5)
Platelets: 315 10*3/uL (ref 140–400)
RBC: 6.23 10*6/uL — ABNORMAL HIGH (ref 4.20–5.80)
RDW: 19.6 % — ABNORMAL HIGH (ref 11.0–15.0)
WBC: 6.6 10*3/uL (ref 3.8–10.8)

## 2020-02-27 LAB — HIV-1 RNA QUANT-NO REFLEX-BLD
HIV 1 RNA Quant: <20 Copies/mL — ABNORMAL HIGH
HIV-1 RNA Quant, Log: <1.3 Log cps/mL — ABNORMAL HIGH

## 2020-02-27 LAB — LIPID PANEL
Cholesterol: 182 mg/dL (ref <200)
HDL: 34 mg/dL — ABNORMAL LOW (ref >=40)
LDL Cholesterol (Calc): 114 mg/dL (calc) — ABNORMAL HIGH
Non-HDL Cholesterol (Calc): 148 mg/dL (calc) — ABNORMAL HIGH (ref <130)
Total CHOL/HDL Ratio: 5.4 (calc) — ABNORMAL HIGH (ref <5.0)
Triglycerides: 225 mg/dL — ABNORMAL HIGH (ref <150)

## 2020-02-27 LAB — RPR: RPR Ser Ql: NONREACTIVE

## 2020-03-04 ENCOUNTER — Ambulatory Visit (INDEPENDENT_AMBULATORY_CARE_PROVIDER_SITE_OTHER): Payer: BC Managed Care – PPO | Admitting: Endocrinology

## 2020-03-04 ENCOUNTER — Other Ambulatory Visit: Payer: Self-pay

## 2020-03-04 VITALS — BP 100/70 | HR 71 | Ht 65.0 in | Wt 158.4 lb

## 2020-03-04 DIAGNOSIS — E119 Type 2 diabetes mellitus without complications: Secondary | ICD-10-CM

## 2020-03-04 LAB — POCT GLYCOSYLATED HEMOGLOBIN (HGB A1C): Hemoglobin A1C: 6.4 % — AB (ref 4.0–5.6)

## 2020-03-04 NOTE — Patient Instructions (Addendum)
Please continue the same 3 diabetes medications.   check your blood sugar once a day.  vary the time of day when you check, between before the 3 meals, and at bedtime.  also check if you have symptoms of your blood sugar being too high or too low.  please keep a record of the readings and bring it to your next appointment here (or you can bring the meter itself).  You can write it on any piece of paper.  please call us sooner if your blood sugar goes below 70, or if you have a lot of readings over 200.   Please come back for a follow-up appointment in 6 months.   

## 2020-03-04 NOTE — Progress Notes (Signed)
Subjective:    Patient ID: Jordan Holmes, male    DOB: 11-25-1960, 60 y.o.   MRN: [id number]  HPI Pt returns for f/u of diabetes mellitus:  DM type: 2 (but he may be developing type 1--lean body habitus and neg FHx).  Dx'ed: 2010 Complications: PN and CRI.   Therapy: 3 oral meds DKA: never Severe hypoglycemia: never.  Pancreatitis: never Pancreatic imaging: never Other: pioglitizone is considered, due to hypertrigliceridemia; ins declined Rybelsus.   Interval history: pt states he feels well in general.  He says cbg's are well-controlled.   Past Medical History:  Diagnosis Date  . Allergy   . Bronchiectasis (HCC)   . Diabetes mellitus   . HIV infection (HCC)   . Hyperlipidemia   . Multiple lung nodules on CT   . Seasonal allergies     Past Surgical History:  Procedure Laterality Date  . VIDEO BRONCHOSCOPY Bilateral 10/02/2015   Procedure: VIDEO BRONCHOSCOPY WITH FLUORO;  Surgeon: Roslynn Amble, MD;  Location: [facility] ENDOSCOPY;  Service: Cardiopulmonary;  Laterality: Bilateral;    Social History   Socioeconomic History  . Marital status: Married    Spouse name: Not on file  . Number of children: 2  . Years of education: Not on file  . Highest education level: Not on file  Occupational History  . Occupation: IT  Tobacco Use  . Smoking status: Never Smoker  . Smokeless tobacco: Never Used  . Tobacco comment: Second-hand exposure through his father  Substance and Sexual Activity  . Alcohol use: No    Alcohol/week: 0.0 standard drinks  . Drug use: No  . Sexual activity: Yes    Partners: Female    Birth control/protection: Condom    Comment: declined condoms  Other Topics Concern  . Not on file  Social History Narrative   Originally from [place]. He move to the [place] in 2005. He has also lived in [place], [place], and also [place]. He has traveled all over the [place]. No pets currently. No bird exposure. No mold or hot tub exposure. He currently works in Consulting civil engineer. Enjoys  playing tennis & walking.   Social Determinants of Health   Financial Resource Strain: Not on file  Food Insecurity: Not on file  Transportation Needs: Not on file  Physical Activity: Not on file  Stress: Not on file  Social Connections: Not on file  Intimate Partner Violence: Not on file    Current Outpatient Medications on File Prior to Visit  Medication Sig Dispense Refill  . atorvastatin (LIPITOR) 10 MG tablet Take 1 tablet (10 mg total) by mouth daily. 90 tablet 3  . bictegravir-emtricitabine-tenofovir AF (BIKTARVY) 50-200-25 MG TABS tablet TAKE 1 TABLET BY MOUTH 1 TIME A DAY. 30 tablet 11  . Cholecalciferol (VITAMIN D3) 3000 units TABS Take 1 tablet by mouth daily.    Marland Kitchen glucose blood (ONETOUCH VERIO) test strip 1 each by Other route daily. And lancets 1/day 100 each 3  . metFORMIN (GLUCOPHAGE-XR) 500 MG 24 hr tablet TAKE 1 TABLET BY MOUTH EVERY DAY 30 tablet 1  . Omega-3 Fatty Acids (FISH OIL) 1200 MG CPDR Take 12,000 mg by mouth daily.    . repaglinide (PRANDIN) 1 MG tablet Take 1 tablet (1 mg total) by mouth daily with supper. 30 tablet 11   No current facility-administered medications on file prior to visit.    Allergies  Allergen Reactions  . Amoxicillin Nausea And Vomiting    Fever and vomiting Diarrhea & fever  Family History  Problem Relation Age of Onset  . Lung disease Neg Hx   . Rheumatologic disease Neg Hx   . Diabetes Neg Hx     BP 100/70 (BP Location: Right Arm, Patient Position: Sitting, Cuff Size: Large)   Pulse 71   Ht 5\' 5"  (1.651 m)   Wt 158 lb 6.4 oz (71.8 kg)   SpO2 99%   BMI 26.36 kg/m    Review of Systems Denies n/v.      Objective:   Physical Exam VITAL SIGNS:  See vs page GENERAL: no distress Pulses: dorsalis pedis intact bilat.   MSK: no deformity of the feet CV: no leg edema Skin:  no ulcer on the feet.  normal color and temp on the feet. Neuro: sensation is intact to touch on the feet  Lab Results  Component Value Date    HGBA1C 6.4 (A) 03/04/2020   Lab Results  Component Value Date   CREATININE 1.36 (H) 02/25/2020   BUN 13 02/25/2020   NA 137 02/25/2020   K 3.9 02/25/2020   CL 104 02/25/2020   CO2 26 02/25/2020      Assessment & Plan:  Type 2 DM: well-controlled.  Patient Instructions  Please continue the same 3 diabetes medications. check your blood sugar once a day.  vary the time of day when you check, between before the 3 meals, and at bedtime.  also check if you have symptoms of your blood sugar being too high or too low.  please keep a record of the readings and bring it to your next appointment here (or you can bring the meter itself).  You can write it on any piece of paper.  please call 02/27/2020 sooner if your blood sugar goes below 70, or if you have a lot of readings over 200.   Please come back for a follow-up appointment in 6 months.

## 2020-03-07 ENCOUNTER — Other Ambulatory Visit: Payer: Self-pay | Admitting: Endocrinology

## 2020-03-23 ENCOUNTER — Other Ambulatory Visit: Payer: Self-pay

## 2020-03-23 ENCOUNTER — Telehealth: Payer: Self-pay

## 2020-03-23 DIAGNOSIS — B20 Human immunodeficiency virus [HIV] disease: Secondary | ICD-10-CM

## 2020-03-23 NOTE — Telephone Encounter (Signed)
Patient called office today requesting lab results from last appointment. Relayed results. Patient is requesting copy be mailed to his home address for personal records.  Patient also states that he was told by pharmacy that his [name] is requiring PA. Provided number for office to call. Will call number tomorrow to initiate PA [phone] [name], CMA

## 2020-03-26 ENCOUNTER — Telehealth: Payer: Self-pay

## 2020-03-26 NOTE — Telephone Encounter (Signed)
Patient is calling stating he is still unable to get his Biktarvy. I spoke with patient's pharmacy [facility] and patient does not need a PA for the [place]. The pharmacy stated patient has a high co pay of $3,000 and patient assistance was denied. Lupita Leash can you tell us what patient's insurance will cover? Diminique T Pricilla Loveless

## 2020-03-27 NOTE — Telephone Encounter (Signed)
I spoke with patient and advised him that Dr. Orvan Falconer would like him to come in and discuss medication changes due to his high copay for the [facility]. Patient verbalized understanding and scheduled for 03/31/20

## 2020-03-27 NOTE — Telephone Encounter (Signed)
I ran some claims and the only two medications that the copay is not high is Truvada generic $10.00 & Isentress 400 it will be $945.00 which I can get a copay coupon card. Or we can change him to Dovato his copay is $2000.00 but I can give him his 1st one free , then have a copay coupon card to make it $0.00

## 2020-03-27 NOTE — Telephone Encounter (Signed)
See note from Lupita Leash below please and advise

## 2020-03-27 NOTE — Telephone Encounter (Signed)
Please see if Jordan Holmes can schedule an appointment to see me next week to discuss these options.  Thanks.

## 2020-03-31 ENCOUNTER — Other Ambulatory Visit: Payer: Self-pay | Admitting: Internal Medicine

## 2020-03-31 ENCOUNTER — Encounter: Payer: Self-pay | Admitting: Internal Medicine

## 2020-03-31 ENCOUNTER — Other Ambulatory Visit: Payer: Self-pay

## 2020-03-31 ENCOUNTER — Ambulatory Visit (INDEPENDENT_AMBULATORY_CARE_PROVIDER_SITE_OTHER): Payer: BC Managed Care – PPO | Admitting: Internal Medicine

## 2020-03-31 DIAGNOSIS — B20 Human immunodeficiency virus [HIV] disease: Secondary | ICD-10-CM

## 2020-03-31 MED ORDER — DOVATO 50-300 MG PO TABS: 1.0000 | ORAL_TABLET | Freq: Every day | ORAL | 11 refills | Status: DC

## 2020-03-31 MED FILL — DOVATO 50-300 MG TABS: 50-300 | 30 days supply | Qty: 30 | Fill #0

## 2020-03-31 NOTE — Progress Notes (Signed)
Patient Active Problem List   Diagnosis Date Noted  . HIV disease (HCC) 07/20/2014    Priority: High  . Nocturnal emission 10/04/2016  . Constipation 04/07/2016  . Penile pain 04/07/2016  . Depression 04/07/2016  . Lung nodules   . Multiple lung nodules on CT 08/14/2014  . Bronchiectasis without acute exacerbation (HCC) 08/14/2014  . Wheezing 08/14/2014  . Seasonal allergies 07/20/2014  . Hypertriglyceridemia 11/16/2010  . Diabetes mellitus, type 2 (HCC) 11/16/2010    Patient's Medications  New Prescriptions   DOLUTEGRAVIR-LAMIVUDINE (DOVATO) 50-300 MG TABS    Take 1 tablet by mouth daily.  Previous Medications   ALOGLIPTIN BENZOATE 25 MG TABS    TAKE 1 TABLET BY MOUTH EVERY DAY   ATORVASTATIN (LIPITOR) 10 MG TABLET    Take 1 tablet (10 mg total) by mouth daily.   CHOLECALCIFEROL (VITAMIN D3) 3000 UNITS TABS    Take 1 tablet by mouth daily.   GLUCOSE BLOOD (ONETOUCH VERIO) TEST STRIP    1 each by Other route daily. And lancets 1/day   METFORMIN (GLUCOPHAGE-XR) 500 MG 24 HR TABLET    TAKE 1 TABLET BY MOUTH EVERY DAY   OMEGA-3 FATTY ACIDS (FISH OIL) 1200 MG CPDR    Take 12,000 mg by mouth daily.   REPAGLINIDE (PRANDIN) 1 MG TABLET    Take 1 tablet (1 mg total) by mouth daily with supper.  Modified Medications   No medications on file  Discontinued Medications   BICTEGRAVIR-EMTRICITABINE-TENOFOVIR AF (BIKTARVY) 50-200-25 MG TABS TABLET    TAKE 1 TABLET BY MOUTH 1 TIME A DAY.    Subjective: Jordan Holmes is in for a work in visit.  He attempted to get a refill of his Jordan Holmes but was denied last week.  Unfortunately he has a very high co-pay you with his current insurance and cannot afford it.  He has not run out yet but only has a few pills left.  Review of Systems: Review of Systems  Constitutional: Negative for fever and malaise/fatigue.    Past Medical History:  Diagnosis Date  . Allergy   . Bronchiectasis (HCC)   . Diabetes mellitus   . HIV infection (HCC)   .  Hyperlipidemia   . Multiple lung nodules on CT   . Seasonal allergies     Social History   Tobacco Use  . Smoking status: Never Smoker  . Smokeless tobacco: Never Used  . Tobacco comment: Second-hand exposure through his father  Substance Use Topics  . Alcohol use: No    Alcohol/week: 0.0 standard drinks  . Drug use: No    Family History  Problem Relation Age of Onset  . Lung disease Neg Hx   . Rheumatologic disease Neg Hx   . Diabetes Neg Hx     Allergies  Allergen Reactions  . Amoxicillin Nausea And Vomiting    Fever and vomiting Diarrhea & fever    Health Maintenance  Topic Date Due  . URINE MICROALBUMIN  Never done  . COLONOSCOPY (Pts 45-5yrs Insurance coverage will need to be confirmed)  Never done  . COVID-19 Vaccine (4 - Booster for Pfizer series) 04/14/2020  . HEMOGLOBIN A1C  09/04/2020  . OPHTHALMOLOGY EXAM  09/18/2020  . FOOT EXAM  03/04/2021  . TETANUS/TDAP  08/24/2027  . INFLUENZA VACCINE  Completed  . PNEUMOCOCCAL POLYSACCHARIDE VACCINE AGE 91-64 HIGH RISK  Completed  . Hepatitis C Screening  Completed  . HIV Screening  Completed  .  HPV VACCINES  Aged Out    Objective:  Vitals:   03/31/20 1357  BP: 107/73  Pulse: 72  Temp: (!) 97.4 F (36.3 C)  TempSrc: Oral  SpO2: 100%  Weight: 159 lb (72.1 kg)  Height: 5\' 5"  (1.651 m)   Body mass index is 26.46 kg/m.  Physical Exam Cardiovascular:     Rate and Rhythm: Normal rate.  Pulmonary:     Effort: Pulmonary effort is normal.  Psychiatric:        Mood and Affect: Mood normal.     Lab Results Lab Results  Component Value Date   WBC 6.6 02/25/2020   HGB 12.6 (L) 02/25/2020   HCT 41.4 02/25/2020   MCV 66.5 (L) 02/25/2020   PLT 315 02/25/2020    Lab Results  Component Value Date   CREATININE 1.36 (H) 02/25/2020   BUN 13 02/25/2020   NA 137 02/25/2020   K 3.9 02/25/2020   CL 104 02/25/2020   CO2 26 02/25/2020    Lab Results  Component Value Date   ALT 20 02/25/2020   AST 20  02/25/2020   ALKPHOS 84 04/07/2016   BILITOT 0.6 02/25/2020    Lab Results  Component Value Date   CHOL 182 02/25/2020   HDL 34 (L) 02/25/2020   LDLCALC 114 (H) 02/25/2020   TRIG 225 (H) 02/25/2020   CHOLHDL 5.4 (H) 02/25/2020   Lab Results  Component Value Date   LABRPR NON-REACTIVE 02/25/2020   HIV 1 RNA Quant  Date Value  02/25/2020 <20 Copies/mL (H)  01/23/2019 <20 NOT DETECTED copies/mL  04/07/2016 24 copies/mL (H)   CD4 T Cell Abs (/uL)  Date Value  02/25/2020 606  01/23/2019 632  04/07/2016 600     Problem List Items Addressed This Visit      High   HIV disease (HCC)    I reviewed the possible options to get him very low cost or free the medications that will still be active against his HIV.  He could take generic Truvada and Isentress (the latter covered by a co-pay card) or Dovato (with co-pay card).  Dovato offers more convenience and fewer potential side effects.  His CD4 count is normal and his viral load has been consistently undetectable so I am comfortable with this option.  He will follow-up in 6 weeks for repeat lab work.      Relevant Medications   Dolutegravir-lamiVUDine (DOVATO) 50-300 MG TABS        06/07/2016, MD [facility] [phone] pager   [phone] cell 03/31/2020, 2:13 PM

## 2020-03-31 NOTE — Assessment & Plan Note (Signed)
I reviewed the possible options to get him very low cost or free the medications that will still be active against his HIV.  He could take generic Truvada and Isentress (the latter covered by a co-pay card) or Dovato (with co-pay card).  Dovato offers more convenience and fewer potential side effects.  His CD4 count is normal and his viral load has been consistently undetectable so I am comfortable with this option.  He will follow-up in 6 weeks for repeat lab work.

## 2020-03-31 NOTE — Addendum Note (Signed)
Addended by: Cliffton Asters on: 03/31/2020 02:15 PM   Modules accepted: Orders

## 2020-04-05 ENCOUNTER — Other Ambulatory Visit: Payer: Self-pay | Admitting: Endocrinology

## 2020-04-22 ENCOUNTER — Other Ambulatory Visit (HOSPITAL_COMMUNITY): Payer: Self-pay

## 2020-04-22 MED FILL — Dolutegravir Sodium-Lamivudine Tab 50-300 MG (Base Eq): ORAL | 30 days supply | Qty: 30 | Fill #0 | Status: CN

## 2020-04-24 ENCOUNTER — Other Ambulatory Visit (HOSPITAL_COMMUNITY): Payer: Self-pay

## 2020-04-24 MED FILL — Dolutegravir Sodium-Lamivudine Tab 50-300 MG (Base Eq): ORAL | 30 days supply | Qty: 30 | Fill #0 | Status: CN

## 2020-04-27 ENCOUNTER — Other Ambulatory Visit (HOSPITAL_COMMUNITY): Payer: Self-pay

## 2020-04-27 MED FILL — Dolutegravir Sodium-Lamivudine Tab 50-300 MG (Base Eq): ORAL | 30 days supply | Qty: 30 | Fill #0 | Status: CN

## 2020-04-27 NOTE — Telephone Encounter (Signed)
error 

## 2020-04-28 ENCOUNTER — Other Ambulatory Visit (HOSPITAL_COMMUNITY): Payer: Self-pay

## 2020-04-28 MED FILL — Dolutegravir Sodium-Lamivudine Tab 50-300 MG (Base Eq): ORAL | 30 days supply | Qty: 30 | Fill #0 | Status: CN

## 2020-04-30 ENCOUNTER — Other Ambulatory Visit: Payer: Self-pay | Admitting: Endocrinology

## 2020-05-08 ENCOUNTER — Other Ambulatory Visit (HOSPITAL_COMMUNITY): Payer: Self-pay

## 2020-05-08 ENCOUNTER — Telehealth: Payer: Self-pay

## 2020-05-08 NOTE — Telephone Encounter (Signed)
Medication Samples have been provided to the patient.  Drug name: Dovato        Strength: 50/300 mg         Qty: 28   LOT: [id number]   Exp.Date: 08/2021  Dosing instructions: Take one tablet by mouth once daily  Clearance Coots, CPhT Specialty Pharmacy Patient [facility] Phone: [phone] Fax:  [phone]

## 2020-05-12 ENCOUNTER — Ambulatory Visit: Payer: BC Managed Care – PPO | Admitting: Internal Medicine

## 2020-05-14 ENCOUNTER — Other Ambulatory Visit: Payer: Self-pay

## 2020-05-14 DIAGNOSIS — B20 Human immunodeficiency virus [HIV] disease: Secondary | ICD-10-CM

## 2020-05-14 MED ORDER — DOLUTEGRAVIR-LAMIVUDINE 50-300 MG PO TABS: 1.0000 | ORAL_TABLET | Freq: Every day | ORAL | 11 refills | Status: DC

## 2020-05-26 ENCOUNTER — Encounter: Payer: Self-pay | Admitting: Internal Medicine

## 2020-05-26 ENCOUNTER — Telehealth: Payer: Self-pay

## 2020-05-26 ENCOUNTER — Ambulatory Visit (INDEPENDENT_AMBULATORY_CARE_PROVIDER_SITE_OTHER): Payer: BC Managed Care – PPO | Admitting: Internal Medicine

## 2020-05-26 ENCOUNTER — Other Ambulatory Visit: Payer: Self-pay

## 2020-05-26 DIAGNOSIS — B20 Human immunodeficiency virus [HIV] disease: Secondary | ICD-10-CM

## 2020-05-26 DIAGNOSIS — H9311 Tinnitus, right ear: Secondary | ICD-10-CM | POA: Diagnosis not present

## 2020-05-26 DIAGNOSIS — R7989 Other specified abnormal findings of blood chemistry: Secondary | ICD-10-CM | POA: Insufficient documentation

## 2020-05-26 NOTE — Telephone Encounter (Signed)
Medication Samples have been provided to the patient.  Drug name: Dovato        Strength: 50/300 mg         Qty: 14  LOT: Alán.Scudder    Exp.Date: 12/23  Clearance Coots, CPhT Specialty Pharmacy Patient [facility] Phone: [phone] Fax:  [phone]

## 2020-05-26 NOTE — Assessment & Plan Note (Signed)
I will recheck his creatinine today.

## 2020-05-26 NOTE — Assessment & Plan Note (Signed)
He has developed tinnitus recently in his right ear.  We will monitor this for now.  Do not think it is related to Dovato or his HIV infection.

## 2020-05-26 NOTE — Progress Notes (Signed)
Patient Active Problem List   Diagnosis Date Noted  . HIV disease (HCC) 07/20/2014    Priority: High  . Elevated serum creatinine 05/26/2020  . 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 Nocturnal emission 10/04/2016  . Constipation 04/07/2016  . Penile pain 04/07/2016  . Depression 04/07/2016  . Lung nodules   . Multiple lung nodules on CT 08/14/2014  . Bronchiectasis without acute exacerbation (HCC) 08/14/2014  . Wheezing 08/14/2014  . Seasonal allergies 07/20/2014  . Hypertriglyceridemia 11/16/2010  . Diabetes mellitus, type 2 (HCC) 11/16/2010    Patient's Medications  New Prescriptions   No medications on file  Previous Medications   ALOGLIPTIN BENZOATE 25 MG TABS    TAKE 1 TABLET BY MOUTH EVERY DAY   ATORVASTATIN (LIPITOR) 10 MG TABLET    Take 1 tablet (10 mg total) by mouth daily.   CHOLECALCIFEROL (VITAMIN D3) 3000 UNITS TABS    Take 1 tablet by mouth daily.   DOLUTEGRAVIR-LAMIVUDINE 50-300 MG TABS    TAKE 1 TABLET BY MOUTH DAILY.   GLUCOSE BLOOD (ONETOUCH VERIO) TEST STRIP    1 each by Other route daily. And lancets 1/day   METFORMIN (GLUCOPHAGE-XR) 500 MG 24 HR TABLET    TAKE 1 TABLET BY MOUTH EVERY DAY   OMEGA-3 FATTY ACIDS (FISH OIL) 1200 MG CPDR    Take 12,000 mg by mouth daily.   REPAGLINIDE (PRANDIN) 1 MG TABLET    Take 1 tablet (1 mg total) by mouth daily with supper.  Modified Medications   No medications on file  Discontinued Medications   No medications on file    Subjective: Jordan Holmes is in for his routine HIV follow-up visit.  2 months ago we had to change his antiretroviral regimen to Dovato as that was the only medication we could get for him because his insurance failed to cover other regimens.  He recalls missing only a few doses in the past few months.  Shortly after starting about he had a short period of time where he was aching all over.  That resolved spontaneously.  He has also been concerned about recent weight gain.  He also mentions that he  has developed some ringing in his right ear that sounds like crickets.  He has not noted any hearing loss.  Review of Systems: Review of Systems  Constitutional: Negative for fever.  HENT: Positive for tinnitus. Negative for ear pain and hearing loss.   Respiratory: Negative for cough and shortness of breath.   Cardiovascular: Negative for chest pain.  Gastrointestinal: Negative for abdominal pain, diarrhea, nausea and vomiting.  Musculoskeletal: Positive for joint pain and myalgias.    Past Medical History:  Diagnosis Date  . Allergy   . Bronchiectasis (HCC)   . Diabetes mellitus   . HIV infection (HCC)   . Hyperlipidemia   . Multiple lung nodules on CT   . Seasonal allergies     Social History   Tobacco Use  . Smoking status: Never Smoker  . Smokeless tobacco: Never Used  . Tobacco comment: Second-hand exposure through his father  Substance Use Topics  . Alcohol use: No    Alcohol/week: 0.0 standard drinks  . Drug use: No    Family History  Problem Relation Age of Onset  . Lung disease Neg Hx   . Rheumatologic disease Neg Hx   . Diabetes Neg Hx     Allergies  Allergen Reactions  . Amoxicillin Nausea And Vomiting  Fever and vomiting Diarrhea & fever    Health Maintenance  Topic Date Due  . URINE MICROALBUMIN  Never done  . COLONOSCOPY (Pts 45-24yrs Insurance coverage will need to be confirmed)  Never done  . INFLUENZA VACCINE  08/03/2020  . HEMOGLOBIN A1C  09/04/2020  . OPHTHALMOLOGY EXAM  09/18/2020  . FOOT EXAM  03/04/2021  . TETANUS/TDAP  05/11/2030  . PNEUMOCOCCAL POLYSACCHARIDE VACCINE AGE 1-64 HIGH RISK  Completed  . COVID-19 Vaccine  Completed  . Hepatitis C Screening  Completed  . HIV Screening  Completed  . HPV VACCINES  Aged Out    Objective:  Vitals:   05/26/20 1545  BP: 118/76  Pulse: 75  Temp: 97.9 F (36.6 C)  TempSrc: Oral  SpO2: 99%  Weight: 160 lb (72.6 kg)  Height: 5\' 5"  (1.651 m)   Body mass index is 26.63  kg/m.  Physical Exam Constitutional:      Comments: His spirits are good.  His weight is up only 2.8 pounds over the past year.  Cardiovascular:     Rate and Rhythm: Normal rate and regular rhythm.     Heart sounds: No murmur heard.   Pulmonary:     Effort: Pulmonary effort is normal.     Breath sounds: Normal breath sounds.  Abdominal:     Palpations: Abdomen is soft.     Tenderness: There is no abdominal tenderness.  Musculoskeletal:        General: No swelling or tenderness.  Skin:    Findings: No rash.  Psychiatric:        Mood and Affect: Mood normal.     Lab Results Lab Results  Component Value Date   WBC 6.6 02/25/2020   HGB 12.6 (L) 02/25/2020   HCT 41.4 02/25/2020   MCV 66.5 (L) 02/25/2020   PLT 315 02/25/2020    Lab Results  Component Value Date   CREATININE 1.36 (H) 02/25/2020   BUN 13 02/25/2020   NA 137 02/25/2020   K 3.9 02/25/2020   CL 104 02/25/2020   CO2 26 02/25/2020    Lab Results  Component Value Date   ALT 20 02/25/2020   AST 20 02/25/2020   ALKPHOS 84 04/07/2016   BILITOT 0.6 02/25/2020    Lab Results  Component Value Date   CHOL 182 02/25/2020   HDL 34 (L) 02/25/2020   LDLCALC 114 (H) 02/25/2020   TRIG 225 (H) 02/25/2020   CHOLHDL 5.4 (H) 02/25/2020   Lab Results  Component Value Date   LABRPR NON-REACTIVE 02/25/2020   HIV 1 RNA Quant  Date Value  02/25/2020 <20 Copies/mL (H)  01/23/2019 <20 NOT DETECTED copies/mL  04/07/2016 24 copies/mL (H)   CD4 T Cell Abs (/uL)  Date Value  02/25/2020 606  01/23/2019 632  04/07/2016 600     Problem List Items Addressed This Visit      High   HIV disease (HCC)    He will get repeat lab work today, continue Dovato and follow-up in 6 months.  He was concerned that some of his recent symptoms could be related to Dovato but I told him that I did not think this was likely.      Relevant Orders   T-helper cell (CD4)- ([facility] clinic only)   HIV-1 RNA quant-no reflex-bld    Comprehensive metabolic panel     Unprioritized   Elevated serum creatinine    I will recheck his creatinine today.      Tinnitus of right  ear    He has developed tinnitus recently in his right ear.  We will monitor this for now.  Do not think it is related to Dovato or his HIV infection.           Cliffton Asters, MD [facility] [phone] pager   [phone] cell 05/26/2020, 5:06 PM

## 2020-05-26 NOTE — Assessment & Plan Note (Signed)
He will get repeat lab work today, continue Dovato and follow-up in 6 months.  He was concerned that some of his recent symptoms could be related to Dovato but I told him that I did not think this was likely.

## 2020-05-27 LAB — T-HELPER CELL (CD4) - (RCID CLINIC ONLY)
CD4 % Helper T Cell: 28 % — ABNORMAL LOW (ref 33–65)
CD4 T Cell Abs: 590 /uL (ref 400–1790)

## 2020-05-28 LAB — COMPREHENSIVE METABOLIC PANEL
AG Ratio: 1.4 (calc) (ref 1.0–2.5)
ALT: 17 U/L (ref 9–46)
AST: 19 U/L (ref 10–35)
Albumin: 4.5 g/dL (ref 3.6–5.1)
Alkaline phosphatase (APISO): 49 U/L (ref 35–144)
BUN/Creatinine Ratio: 10 (calc) (ref 6–22)
BUN: 14 mg/dL (ref 7–25)
CO2: 28 mmol/L (ref 20–32)
Calcium: 9.3 mg/dL (ref 8.6–10.3)
Chloride: 101 mmol/L (ref 98–110)
Creat: 1.34 mg/dL — ABNORMAL HIGH (ref 0.70–1.33)
Globulin: 3.2 g/dL (calc) (ref 1.9–3.7)
Glucose, Bld: 182 mg/dL — ABNORMAL HIGH (ref 65–99)
Potassium: 3.9 mmol/L (ref 3.5–5.3)
Sodium: 136 mmol/L (ref 135–146)
Total Bilirubin: 0.4 mg/dL (ref 0.2–1.2)
Total Protein: 7.7 g/dL (ref 6.1–8.1)

## 2020-05-28 LAB — HIV-1 RNA QUANT-NO REFLEX-BLD
HIV 1 RNA Quant: 25 Copies/mL — ABNORMAL HIGH
HIV-1 RNA Quant, Log: 1.4 Log cps/mL — ABNORMAL HIGH

## 2020-05-30 ENCOUNTER — Other Ambulatory Visit: Payer: Self-pay | Admitting: Endocrinology

## 2020-06-05 ENCOUNTER — Other Ambulatory Visit (HOSPITAL_COMMUNITY): Payer: Self-pay

## 2020-06-26 ENCOUNTER — Other Ambulatory Visit (HOSPITAL_COMMUNITY): Payer: Self-pay

## 2020-07-01 ENCOUNTER — Other Ambulatory Visit: Payer: Self-pay | Admitting: Endocrinology

## 2020-07-10 ENCOUNTER — Other Ambulatory Visit: Payer: Self-pay | Admitting: Endocrinology

## 2020-07-13 ENCOUNTER — Telehealth: Payer: Self-pay

## 2020-07-13 NOTE — Telephone Encounter (Signed)
Attempted to call patient after receiving fax from from [facility] stating they have made multiple attempts to reach patient regarding refills. Have not been able to reach him and will be closing his case. Patient will need to call pharmacy to resume services. Left voicemail requesting patient call pharmacy back. Juanita Laster, RMA

## 2020-07-20 ENCOUNTER — Other Ambulatory Visit (HOSPITAL_COMMUNITY): Payer: Self-pay

## 2020-08-02 ENCOUNTER — Other Ambulatory Visit: Payer: Self-pay | Admitting: Endocrinology

## 2020-08-11 ENCOUNTER — Other Ambulatory Visit: Payer: Self-pay | Admitting: Endocrinology

## 2020-08-11 NOTE — Telephone Encounter (Signed)
Patient called to request the following Rx with refills be resent asap to [facility] due PHARM told Patient they never received the RX on 07/01/20 and PHARM told Patient they have sent requests for the following RX:  MEDICATION: repaglinide (PRANDIN) 1 MG tablet  PHARMACY:   [id number] - [place], [place] - [address] Phone:  [phone]  Fax:  [phone]      HAS THE PATIENT CONTACTED THEIR PHARMACY?  Yes-Please see above   IS THIS A 90 DAY SUPPLY : If possible Patient would like 90 day supply  IS PATIENT OUT OF MEDICATION: Yes  IF NOT; HOW MUCH IS LEFT: 0  LAST APPOINTMENT DATE: @3 /02/2020  NEXT APPOINTMENT DATE:@9 /09/2020  DO WE HAVE YOUR PERMISSION TO LEAVE A DETAILED MESSAGE?: Yes  OTHER COMMENTS:    **Let patient know to contact pharmacy at the end of the day to make sure medication is ready. **  ** Please notify patient to allow 48-72 hours to process**  **Encourage patient to contact the pharmacy for refills or they can request refills through [facility]**

## 2020-08-20 ENCOUNTER — Telehealth: Payer: Self-pay | Admitting: Endocrinology

## 2020-08-20 NOTE — Telephone Encounter (Signed)
Pt is calling regarding this medication. Pt is requesting a call back on the reason why this is not being filled.

## 2020-08-24 ENCOUNTER — Other Ambulatory Visit: Payer: Self-pay

## 2020-08-24 DIAGNOSIS — E119 Type 2 diabetes mellitus without complications: Secondary | ICD-10-CM

## 2020-08-24 MED ORDER — REPAGLINIDE 1 MG PO TABS: 1.0000 mg | ORAL_TABLET | Freq: Every day | ORAL | 11 refills | Status: DC

## 2020-08-24 NOTE — Telephone Encounter (Signed)
Pt is calling regarding this medication. Pt is requesting a call back on the reason why this is not being filled. Is out of RX.  States that he has called multiple times and nothing is being done. Would like a call back on this matter.

## 2020-09-02 ENCOUNTER — Other Ambulatory Visit: Payer: Self-pay | Admitting: Endocrinology

## 2020-09-11 ENCOUNTER — Ambulatory Visit: Payer: BC Managed Care – PPO | Admitting: Endocrinology

## 2020-09-25 ENCOUNTER — Other Ambulatory Visit: Payer: Self-pay | Admitting: Endocrinology

## 2020-10-14 DIAGNOSIS — H8112 Benign paroxysmal vertigo, left ear: Secondary | ICD-10-CM | POA: Diagnosis not present

## 2020-10-20 DIAGNOSIS — Z0000 Encounter for general adult medical examination without abnormal findings: Secondary | ICD-10-CM | POA: Diagnosis not present

## 2020-10-20 DIAGNOSIS — E119 Type 2 diabetes mellitus without complications: Secondary | ICD-10-CM | POA: Diagnosis not present

## 2020-10-20 DIAGNOSIS — Z23 Encounter for immunization: Secondary | ICD-10-CM | POA: Diagnosis not present

## 2020-10-20 DIAGNOSIS — Z7984 Long term (current) use of oral hypoglycemic drugs: Secondary | ICD-10-CM | POA: Diagnosis not present

## 2020-10-23 ENCOUNTER — Ambulatory Visit: Payer: BC Managed Care – PPO | Admitting: Endocrinology

## 2020-12-01 ENCOUNTER — Ambulatory Visit: Payer: BC Managed Care – PPO | Admitting: Internal Medicine

## 2020-12-16 ENCOUNTER — Ambulatory Visit: Payer: BC Managed Care – PPO | Admitting: Endocrinology

## 2021-01-11 ENCOUNTER — Other Ambulatory Visit (HOSPITAL_COMMUNITY): Payer: Self-pay

## 2021-01-12 ENCOUNTER — Ambulatory Visit: Payer: BC Managed Care – PPO | Admitting: Internal Medicine

## 2021-01-19 ENCOUNTER — Ambulatory Visit: Payer: BC Managed Care – PPO | Admitting: Endocrinology

## 2021-02-02 ENCOUNTER — Other Ambulatory Visit: Payer: Self-pay | Admitting: Endocrinology

## 2021-02-04 ENCOUNTER — Ambulatory Visit: Payer: BC Managed Care – PPO | Admitting: Endocrinology

## 2021-02-05 ENCOUNTER — Ambulatory Visit: Payer: BC Managed Care – PPO | Admitting: Endocrinology

## 2021-02-15 ENCOUNTER — Telehealth: Payer: Self-pay | Admitting: Endocrinology

## 2021-02-15 DIAGNOSIS — E119 Type 2 diabetes mellitus without complications: Secondary | ICD-10-CM

## 2021-02-15 NOTE — Telephone Encounter (Signed)
Pt is calling in stating that he is needing a refill on Rx's metformin (GLUCOPHAGE-XR) 500 MG, Alogliptin Benzoate 25 MG and repaglinide (PRANDIN) 1 MG pt stated that he is out of these medication and would like to see if it can be called in today.  [facility] [address].  Pt would like to have a call back to let him know it has been called in so that he is able to go and pick them up.

## 2021-02-16 MED ORDER — ALOGLIPTIN BENZOATE 25 MG PO TABS: 1.0000 | ORAL_TABLET | Freq: Every day | ORAL | 0 refills | Status: DC

## 2021-02-16 MED ORDER — REPAGLINIDE 1 MG PO TABS: 1.0000 mg | ORAL_TABLET | Freq: Every day | ORAL | 11 refills | Status: DC

## 2021-02-16 MED ORDER — METFORMIN HCL ER 500 MG PO TB24: 500.0000 mg | ORAL_TABLET | Freq: Every day | ORAL | 0 refills | Status: DC

## 2021-02-16 NOTE — Telephone Encounter (Signed)
Patient's prescriptions have now been sent into preferred pharmacy. Attempted to contact the patient to inform him. Received no answer. LVM to inform him.

## 2021-02-18 ENCOUNTER — Other Ambulatory Visit: Payer: Self-pay

## 2021-02-18 ENCOUNTER — Ambulatory Visit (INDEPENDENT_AMBULATORY_CARE_PROVIDER_SITE_OTHER): Payer: No Typology Code available for payment source | Admitting: Endocrinology

## 2021-02-18 VITALS — BP 110/78 | HR 82 | Ht 65.0 in | Wt 162.0 lb

## 2021-02-18 DIAGNOSIS — E119 Type 2 diabetes mellitus without complications: Secondary | ICD-10-CM

## 2021-02-18 LAB — POCT GLYCOSYLATED HEMOGLOBIN (HGB A1C): Hemoglobin A1C: 6.7 % — AB (ref 4.0–5.6)

## 2021-02-18 MED ORDER — PIOGLITAZONE HCL 45 MG PO TABS: 45.0000 mg | ORAL_TABLET | Freq: Every day | ORAL | 3 refills | Status: DC

## 2021-02-18 NOTE — Progress Notes (Signed)
Subjective:    Patient ID: Jordan Holmes, male    DOB: 1960/08/20, 61 y.o.   MRN: [id number]  HPI Pt returns for f/u of diabetes mellitus:  DM type: 2 (but he may be developing type 1--lean body habitus and neg FHx).  Dx'ed: [id number] Complications: PN and CRI.   Therapy: 3 oral meds DKA: never Severe hypoglycemia: never.  Pancreatitis: never Pancreatic imaging: never.   Other: he also has hypertrigliceridemia; ins declined Rybelsus.   Interval history: pt states he feels well in general.  He says cbg's vary from 55-135.  He has mild hypoglycemia approx weekly.  He cannot afford alogliptin.    Past Medical History:  Diagnosis Date   Allergy    Bronchiectasis ([place])    Diabetes mellitus    HIV infection ([place])    Hyperlipidemia    Multiple lung nodules on CT    Seasonal allergies     Past Surgical History:  Procedure Laterality Date   VIDEO BRONCHOSCOPY Bilateral 10/02/2015   Procedure: VIDEO BRONCHOSCOPY WITH FLUORO;  Surgeon: Javier Glazier, MD;  Location: [facility];  Service: Cardiopulmonary;  Laterality: Bilateral;    Social History   Socioeconomic History   Marital status: Married    Spouse name: Not on file   Number of children: 2   Years of education: Not on file   Highest education level: Not on file  Occupational History   Occupation: IT  Tobacco Use   Smoking status: Never   Smokeless tobacco: Never   Tobacco comments:    Second-hand exposure through his father  Substance and Sexual Activity   Alcohol use: No    Alcohol/week: 0.0 standard drinks   Drug use: No   Sexual activity: Yes    Partners: Female    Birth control/protection: Condom    Comment: given condoms 05/26/20  Other Topics Concern   Not on file  Social History Narrative   Originally from [place]. He move to the [place] in 2005. He has also lived in [place], [place], and also [place]. He has traveled all over the [place]. No pets currently. No bird exposure. No mold or hot tub exposure. He currently  works in Engineer, technical sales. Enjoys playing tennis & walking.   Social Determinants of Health   Financial Resource Strain: Not on file  Food Insecurity: Not on file  Transportation Needs: Not on file  Physical Activity: Not on file  Stress: Not on file  Social Connections: Not on file  Intimate Partner Violence: Not on file    Current Outpatient Medications on File Prior to Visit  Medication Sig Dispense Refill   atorvastatin (LIPITOR) 10 MG tablet Take 1 tablet (10 mg total) by mouth daily. 90 tablet 3   Cholecalciferol (VITAMIN D3) 3000 units TABS Take 1 tablet by mouth daily.     Dolutegravir-lamiVUDine 50-300 MG TABS TAKE 1 TABLET BY MOUTH DAILY. 30 tablet 11   glucose blood (ONETOUCH VERIO) test strip 1 each by Other route daily. And lancets 1/day 100 each 3   metFORMIN (GLUCOPHAGE-XR) 500 MG 24 hr tablet Take 1 tablet (500 mg total) by mouth daily. 30 tablet 0   Omega-3 Fatty Acids (FISH OIL) 1200 MG CPDR Take 12,000 mg by mouth daily.     repaglinide (PRANDIN) 1 MG tablet Take 1 tablet (1 mg total) by mouth daily with supper. 30 tablet 11   No current facility-administered medications on file prior to visit.    Allergies  Allergen Reactions   Amoxicillin Nausea  And Vomiting    Fever and vomiting Diarrhea & fever    Family History  Problem Relation Age of Onset   Lung disease Neg Hx    Rheumatologic disease Neg Hx    Diabetes Neg Hx     BP 110/78    Pulse 82    Ht 5\' 5"  (1.651 m)    Wt 162 lb (73.5 kg)    SpO2 98%    BMI 26.96 kg/m    Review of Systems     Objective:   Physical Exam VITAL SIGNS:  See vs page.   GENERAL: no distress.   CV: no leg edema.    Lab Results  Component Value Date   CHOL 182 02/25/2020   HDL 34 (L) 02/25/2020   LDLCALC 114 (H) 02/25/2020   TRIG 225 (H) 02/25/2020   CHOLHDL 5.4 (H) 02/25/2020    Lab Results  Component Value Date   HGBA1C 6.7 (A) 02/18/2021      Assessment & Plan:  Type 2 DM: well-controlled Hypertriglyceridemia:  uncontrolled   Patient Instructions  I have sent a prescription to your pharmacy, to change the alogliptin to pioglitazone.  Please continue the same other 2 diabetes medications.  check your blood sugar once a day.  vary the time of day when you check, between before the 3 meals, and at bedtime.  also check if you have symptoms of your blood sugar being too high or too low.  please keep a record of the readings and bring it to your next appointment here (or you can bring the meter itself).  You can write it on any piece of paper.  please call us sooner if your blood sugar goes below 70, or if you have a lot of readings over 200.   Please come back for a follow-up appointment in 2-3 months, fasting.

## 2021-02-18 NOTE — Patient Instructions (Addendum)
I have sent a prescription to your pharmacy, to change the alogliptin to pioglitazone.  Please continue the same other 2 diabetes medications.  check your blood sugar once a day.  vary the time of day when you check, between before the 3 meals, and at bedtime.  also check if you have symptoms of your blood sugar being too high or too low.  please keep a record of the readings and bring it to your next appointment here (or you can bring the meter itself).  You can write it on any piece of paper.  please call us sooner if your blood sugar goes below 70, or if you have a lot of readings over 200.   Please come back for a follow-up appointment in 2-3 months, fasting.

## 2021-02-23 ENCOUNTER — Other Ambulatory Visit: Payer: Self-pay

## 2021-02-23 ENCOUNTER — Ambulatory Visit (INDEPENDENT_AMBULATORY_CARE_PROVIDER_SITE_OTHER): Payer: No Typology Code available for payment source | Admitting: Internal Medicine

## 2021-02-23 ENCOUNTER — Other Ambulatory Visit (HOSPITAL_COMMUNITY): Payer: Self-pay

## 2021-02-23 DIAGNOSIS — B20 Human immunodeficiency virus [HIV] disease: Secondary | ICD-10-CM | POA: Diagnosis not present

## 2021-02-23 NOTE — Progress Notes (Signed)
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   CHOLECALCIFEROL (VITAMIN D3) 3000 UNITS TABS    Take 1 tablet by mouth daily.   DOLUTEGRAVIR-LAMIVUDINE 50-300 MG TABS    TAKE 1 TABLET BY MOUTH DAILY.   GLUCOSE BLOOD (ONETOUCH VERIO) TEST STRIP    1 each by Other route daily. And lancets 1/day   METFORMIN (GLUCOPHAGE-XR) 500 MG 24 HR TABLET    Take 1 tablet (500 mg total) by mouth daily.   OMEGA-3 FATTY ACIDS (FISH OIL) 1200 MG CPDR    Take 12,000 mg by mouth daily.   PIOGLITAZONE (ACTOS) 45 MG TABLET    Take 1 tablet (45 mg total) by mouth daily.   REPAGLINIDE (PRANDIN) 1 MG TABLET    Take 1 tablet (1 mg total) by mouth daily with supper.  Modified Medications   No medications on file  Discontinued Medications   No medications on file    Subjective: Jordan Holmes is in for his routine follow-up visit.  He ran out of his Dovato 3 weeks ago.  Unfortunately, he, again signed up for terrible health insurance that does not cover any brand-name antiretroviral medications.  His insurance also does not cover his diabetic medications.  Review of Systems: Review of Systems  Constitutional:  Negative for fever and weight loss.   Past Medical History:  Diagnosis Date   Allergy     Bronchiectasis (HCC)    Diabetes mellitus    HIV infection (HCC)    Hyperlipidemia    Multiple lung nodules on CT    Seasonal allergies     Social History   Tobacco Use   Smoking status: Never   Smokeless tobacco: Never   Tobacco comments:    Second-hand exposure through his father  Substance Use Topics   Alcohol use: No    Alcohol/week: 0.0 standard drinks   Drug use: No    Family History  Problem Relation Age of Onset   Lung disease Neg Hx    Rheumatologic disease Neg Hx    Diabetes Neg Hx     Allergies  Allergen Reactions   Amoxicillin Nausea And Vomiting    Fever and vomiting Diarrhea & fever    Objective: Vitals:   02/23/21 1517  BP: 124/82  Pulse: 89  Temp: 98 F (36.7 C)  TempSrc: Temporal  SpO2: 98%  Weight: 162 lb (73.5 kg)   Body mass index is 26.96 kg/m.  Physical Exam Constitutional:      General: He is not in acute distress. Cardiovascular:     Rate and Rhythm: Normal rate.  Pulmonary:     Effort: Pulmonary effort is normal.  Psychiatric:        Mood and Affect: Mood normal.  Lab Results HIV 1 RNA Quant  Date Value  05/26/2020 25 Copies/mL (H)  02/25/2020 <20 Copies/mL (H)  01/23/2019 <20 NOT DETECTED copies/mL   CD4 T Cell Abs (/uL)  Date Value  05/26/2020 590  02/25/2020 606  01/23/2019 632      Problem List Items Addressed This Visit       High   HIV disease (HCC)    Unfortunately, he keeps signing up for terrible health insurance plans that do not cover his needed medications.  We will see if we can get him at least a 1 month, free supply of medication and I have asked our pharmacist to review currently available multidrug, generic options.  I will arrange a video follow-up visit in 4 weeks.      Relevant Orders   HIV-1 RNA quant-no reflex-bld   T-helper cells (CD4) count (not at [facility])     Cliffton Asters, MD [facility] for Infectious Disease [facility] [phone] pager   [phone]  cell 02/23/2021, 3:47 PM

## 2021-02-23 NOTE — Assessment & Plan Note (Signed)
Unfortunately, he keeps signing up for terrible health insurance plans that do not cover his needed medications.  We will see if we can get him at least a 1 month, free supply of medication and I have asked our pharmacist to review currently available multidrug, generic options.  I will arrange a video follow-up visit in 4 weeks.

## 2021-02-25 LAB — T-HELPER CELLS (CD4) COUNT (NOT AT ARMC)
Absolute CD4: 450 cells/uL — ABNORMAL LOW (ref 490–1740)
CD4 T Helper %: 21 % — ABNORMAL LOW (ref 30–61)
Total lymphocyte count: 2189 cells/uL (ref 850–3900)

## 2021-02-25 LAB — HIV-1 RNA QUANT-NO REFLEX-BLD
HIV 1 RNA Quant: 78900 Copies/mL — ABNORMAL HIGH
HIV-1 RNA Quant, Log: 4.9 Log cps/mL — ABNORMAL HIGH

## 2021-03-01 ENCOUNTER — Other Ambulatory Visit (HOSPITAL_COMMUNITY): Payer: Self-pay

## 2021-03-02 ENCOUNTER — Telehealth: Payer: Self-pay | Admitting: Internal Medicine

## 2021-03-02 NOTE — Telephone Encounter (Signed)
Bobbie Stack, RN  Michel Bickers, MD; Roney Jaffe, CPhT; Robert Bellow, PA-C Marshell Levan said that Jordan Holmes was having trouble getting his medications covered and asked if we had a study that may provide some. The only one that he possibly may qualify for is the one for nonadherent individuals trying to get on injectables. He would need another viral load over 200 in April and be willing to come for appts. every month. He may not qualify anyway if he has any resistant mutations. We could provide oral meds but not dovato for the first 24 weeks.   Do you think he would be interested?   Maudie Mercury

## 2021-03-09 ENCOUNTER — Other Ambulatory Visit (HOSPITAL_COMMUNITY): Payer: Self-pay

## 2021-03-23 ENCOUNTER — Other Ambulatory Visit (HOSPITAL_COMMUNITY): Payer: Self-pay

## 2021-03-24 ENCOUNTER — Other Ambulatory Visit: Payer: Self-pay

## 2021-03-24 ENCOUNTER — Ambulatory Visit (INDEPENDENT_AMBULATORY_CARE_PROVIDER_SITE_OTHER): Payer: No Typology Code available for payment source | Admitting: Internal Medicine

## 2021-03-24 DIAGNOSIS — B20 Human immunodeficiency virus [HIV] disease: Secondary | ICD-10-CM

## 2021-03-24 NOTE — Progress Notes (Signed)
Virtual Visit via Telephone Note ? ?I connected with Jordan Holmes on 03/24/21 at 10:45 AM EDT by telephone and verified that I am speaking with the correct person using two identifiers. ? ?Location: ?Patient: Work ?Provider: [facility] ?  ?I discussed the limitations, risks, security and privacy concerns of performing an evaluation and management service by telephone and the availability of in person appointments. I also discussed with the patient that there may be a patient responsible charge related to this service. The patient expressed understanding and agreed to proceed. ? ? ?History of Present Illness: ?I called and spoke with Jordan Holmes today.  He remains off of antiretroviral medication because she has insurance does not cover any effective regimens and affordable way.  Pharmacy staff has reviewed all possible assistance programs and come up empty handed. ?  ?Observations/Objective: ?HIV 1 RNA Quant (Copies/mL)  ?Date Value  ?02/23/2021 78,900 (H)  ?05/26/2020 25 (H)  ?02/25/2020 <20 (H)  ? ?CD4 T Cell Abs (/uL)  ?Date Value  ?05/26/2020 590  ?02/25/2020 606  ?01/23/2019 632  ?  ? ?Assessment and Plan: ?Unfortunately, there are currently no affordable treatment options that will be effective. ? ?Follow Up Instructions: ?Observe off of treatment ?Follow-up in November to provide assistance in obtaining new and better insurance coverage ?  ?I discussed the assessment and treatment plan with the patient. The patient was provided an opportunity to ask questions and all were answered. The patient agreed with the plan and demonstrated an understanding of the instructions. ?  ?The patient was advised to call back or seek an in-person evaluation if the symptoms worsen or if the condition fails to improve as anticipated. ? ?I provided 15 minutes of non-face-to-face time during this encounter. ? ? ?Cliffton Asters, MD  ?

## 2021-03-26 ENCOUNTER — Other Ambulatory Visit: Payer: Self-pay | Admitting: Endocrinology

## 2021-03-26 DIAGNOSIS — E119 Type 2 diabetes mellitus without complications: Secondary | ICD-10-CM

## 2021-04-15 ENCOUNTER — Other Ambulatory Visit: Payer: Self-pay | Admitting: Endocrinology

## 2021-04-15 DIAGNOSIS — E119 Type 2 diabetes mellitus without complications: Secondary | ICD-10-CM

## 2021-05-11 ENCOUNTER — Other Ambulatory Visit: Payer: Self-pay | Admitting: Endocrinology

## 2021-05-11 DIAGNOSIS — E119 Type 2 diabetes mellitus without complications: Secondary | ICD-10-CM

## 2021-05-20 ENCOUNTER — Ambulatory Visit: Payer: Self-pay | Admitting: Endocrinology

## 2021-08-09 ENCOUNTER — Telehealth: Payer: Self-pay

## 2021-08-09 NOTE — Telephone Encounter (Signed)
Patient called complaining of a bill that he received from [facility] from having labs done for our office visit. Patient stated the ICD10 code (B20) submitted for his labs does not cover the labs drawn.   I spoke with [facility] [phone]) and they also stated the code B20 is not covering the labs. I also submitted the code for routine check up (Z00.00) on the phone to see if that will cover the labs.   I attempted to contact the patient and patient did not answer to let him know he needs to speak with his wife's insurance company as well to discuss the bills that he keeps getting from [facility]. Patient did not answer and a voicemail was left for him to call back. Nickolaos Brallier T Pricilla Loveless

## 2021-10-13 ENCOUNTER — Other Ambulatory Visit: Payer: Self-pay

## 2021-10-13 ENCOUNTER — Telehealth: Payer: Self-pay

## 2021-10-13 ENCOUNTER — Telehealth: Payer: Self-pay | Admitting: Internal Medicine

## 2021-10-13 DIAGNOSIS — E119 Type 2 diabetes mellitus without complications: Secondary | ICD-10-CM

## 2021-10-13 MED ORDER — PIOGLITAZONE HCL 45 MG PO TABS: 45.0000 mg | ORAL_TABLET | Freq: Every day | ORAL | 1 refills | Status: DC

## 2021-10-13 MED ORDER — REPAGLINIDE 1 MG PO TABS: 1.0000 mg | ORAL_TABLET | Freq: Every day | ORAL | 11 refills | Status: DC

## 2021-10-13 MED ORDER — METFORMIN HCL ER 500 MG PO TB24: 500.0000 mg | ORAL_TABLET | Freq: Every day | ORAL | 1 refills | Status: DC

## 2021-10-13 NOTE — Telephone Encounter (Signed)
All RX now sent to pharmacy

## 2021-10-13 NOTE — Telephone Encounter (Signed)
Contact patient for follow up appointment. Last office visit was 02/18/21

## 2021-10-13 NOTE — Telephone Encounter (Signed)
MEDICATION: Metformin XR 500mg , Repaglinide 1 mg, Pioglitazone 45 mg   PHARMACY:  [facility] [address]  HAS THE PATIENT CONTACTED THEIR PHARMACY?  no  IS THIS A 90 DAY SUPPLY : unknown  IS PATIENT OUT OF MEDICATION: yes  IF NOT; HOW MUCH IS LEFT:   LAST APPOINTMENT DATE: @2 /16/2023  NEXT APPOINTMENT DATE:@2 /23/2024  DO WE HAVE YOUR PERMISSION TO LEAVE A DETAILED MESSAGE?:  OTHER COMMENTS:    **Let patient know to contact pharmacy at the end of the day to make sure medication is ready. **  ** Please notify patient to allow 48-72 hours to process**  **Encourage patient to contact the pharmacy for refills or they can request refills through [facility]**

## 2021-11-11 ENCOUNTER — Other Ambulatory Visit: Payer: Self-pay | Admitting: Internal Medicine

## 2021-11-11 DIAGNOSIS — E119 Type 2 diabetes mellitus without complications: Secondary | ICD-10-CM

## 2021-11-16 ENCOUNTER — Other Ambulatory Visit (HOSPITAL_COMMUNITY): Payer: Self-pay

## 2021-11-17 ENCOUNTER — Ambulatory Visit: Payer: No Typology Code available for payment source | Admitting: Internal Medicine

## 2022-02-17 ENCOUNTER — Other Ambulatory Visit: Payer: Self-pay | Admitting: Internal Medicine

## 2022-02-17 DIAGNOSIS — E119 Type 2 diabetes mellitus without complications: Secondary | ICD-10-CM

## 2022-02-25 ENCOUNTER — Encounter: Payer: Self-pay | Admitting: Internal Medicine

## 2022-02-25 ENCOUNTER — Ambulatory Visit (INDEPENDENT_AMBULATORY_CARE_PROVIDER_SITE_OTHER): Payer: BC Managed Care – PPO | Admitting: Internal Medicine

## 2022-02-25 ENCOUNTER — Other Ambulatory Visit: Payer: Self-pay | Admitting: Internal Medicine

## 2022-02-25 VITALS — BP 120/80 | HR 80 | Ht 65.0 in | Wt 167.0 lb

## 2022-02-25 DIAGNOSIS — E119 Type 2 diabetes mellitus without complications: Secondary | ICD-10-CM

## 2022-02-25 DIAGNOSIS — E785 Hyperlipidemia, unspecified: Secondary | ICD-10-CM | POA: Insufficient documentation

## 2022-02-25 LAB — POCT GLYCOSYLATED HEMOGLOBIN (HGB A1C): Hemoglobin A1C: 6.5 % — AB (ref 4.0–5.6)

## 2022-02-25 LAB — POCT GLUCOSE (DEVICE FOR HOME USE): POC Glucose: 134 mg/dl — AB (ref 70–99)

## 2022-02-25 MED ORDER — REPAGLINIDE 1 MG PO TABS: 1.0000 mg | ORAL_TABLET | Freq: Every day | ORAL | 3 refills | Status: DC

## 2022-02-25 MED ORDER — PIOGLITAZONE HCL 45 MG PO TABS: 45.0000 mg | ORAL_TABLET | Freq: Every day | ORAL | 3 refills | Status: DC

## 2022-02-25 MED ORDER — METFORMIN HCL ER 500 MG PO TB24: 500.0000 mg | ORAL_TABLET | Freq: Every day | ORAL | 3 refills | Status: DC

## 2022-02-25 NOTE — Patient Instructions (Signed)
Continue Metformin 500 mg XR 1 tablet Before breakfast  Continue Repaglinide 1 mg,, 1 tablet before supper Continue Pioglitazone 45 mg, 1 tablet before breakfast daily     HOW TO TREAT LOW BLOOD SUGARS (Blood sugar LESS THAN 70 MG/DL) Please follow the RULE OF 15 for the treatment of hypoglycemia treatment (when your (blood sugars are less than 70 mg/dL)   STEP 1: Take 15 grams of carbohydrates when your blood sugar is low, which includes:  3-4 GLUCOSE TABS  OR 3-4 OZ OF JUICE OR REGULAR SODA OR ONE TUBE OF GLUCOSE GEL    STEP 2: RECHECK blood sugar in 15 MINUTES STEP 3: If your blood sugar is still low at the 15 minute recheck --> then, go back to STEP 1 and treat AGAIN with another 15 grams of carbohydrates.

## 2022-02-25 NOTE — Progress Notes (Signed)
Name: Jordan Holmes  Age/ Sex: 62 y.o., male   MRN/ DOB: [id number], 1960-02-19     PCP: Orpah Melter, MD   Reason for Endocrinology Evaluation: Type 2 Diabetes Mellitus  Initial Endocrine Consultative Visit: 03/30/2016    PATIENT IDENTIFIER: Jordan Holmes is a 62 y.o. male with a past medical history of DM, HIV and bronchiectasis . The patient has followed with Endocrinology clinic since 03/30/2016 for consultative assistance with management of his diabetes.  DIABETIC HISTORY:  Jordan Holmes was diagnosed with DM 2010, he has not been on insulin in the past . His hemoglobin A1c has ranged from 6.4% in 2022, peaking at 7.5% in 2016.  Rybelsus was not covered by insurance and alogliptin is cost prohibitive per his previus endocrinologist note    Saw Dr. Loanne Holmes from  2018   until 02/2021 SUBJECTIVE:   During the last visit (02/18/2021): Saw Dr Loanne Holmes   Today (02/25/2022): Jordan Holmes  He checks his blood sugars occasoinally times daily. The patient has not had hypoglycemic episodes since the last clinic visit.   Eats 3 meals a day Avoids soda but avoids sugar-sweetened beverages   Has occasional constipation but no diarrhea  Denies nausea Occasional knee pains    HOME DIABETES REGIMEN:  Metformin 500 mg XR 1 tablet after breakfast  Repaglinide 1 mg with supper Pioglitazone 45 mg daily     Statin: yes ACE-I/ARB: no    METER DOWNLOAD SUMMARY: n/a   DIABETIC COMPLICATIONS: Microvascular complications:   Denies: CKD Last Eye Exam: Completed   Macrovascular complications:   Denies: CAD, CVA, PVD   HISTORY:  Past Medical History:  Past Medical History:  Diagnosis Date   Allergy    Bronchiectasis ([place])    Diabetes mellitus    HIV infection ([place])    Hyperlipidemia    Multiple lung nodules on CT    Seasonal allergies    Past Surgical History:  Past Surgical History:  Procedure Laterality Date   VIDEO BRONCHOSCOPY Bilateral 10/02/2015   Procedure:  VIDEO BRONCHOSCOPY WITH FLUORO;  Surgeon: Javier Glazier, MD;  Location: [facility];  Service: Cardiopulmonary;  Laterality: Bilateral;   Social History:  reports that he has never smoked. He has never used smokeless tobacco. He reports that he does not drink alcohol and does not use drugs. Family History:  Family History  Problem Relation Age of Onset   Lung disease Neg Hx    Rheumatologic disease Neg Hx    Diabetes Neg Hx      HOME MEDICATIONS: Allergies as of 02/25/2022       Reactions   Amoxicillin Nausea And Vomiting   Fever and vomiting Diarrhea & fever        Medication List        Accurate as of February 25, 2022  3:48 PM. If you have any questions, ask your nurse or doctor.          STOP taking these medications    atorvastatin 10 MG tablet Commonly known as: LIPITOR Stopped by: Dorita Sciara, MD       TAKE these medications    dolutegravir-lamiVUDine 50-300 MG tablet Commonly known as: DOVATO TAKE 1 TABLET BY MOUTH DAILY.   Fish Oil 1200 MG Cpdr Take 12,000 mg by mouth daily.   metFORMIN 500 MG 24 hr tablet Commonly known as: GLUCOPHAGE-XR TAKE 1 TABLET BY MOUTH DAILY   OneTouch Verio test strip Generic drug: glucose blood 1 each by Other route daily. And lancets  1/day   pioglitazone 45 MG tablet Commonly known as: ACTOS TAKE 1 TABLET BY MOUTH DAILY   repaglinide 1 MG tablet Commonly known as: PRANDIN Take 1 tablet (1 mg total) by mouth daily with supper.   rosuvastatin 5 MG tablet Commonly known as: CRESTOR Take 5 mg by mouth daily.   Vitamin D3 75 MCG (3000 UT) Tabs Generic drug: Cholecalciferol Take 1 tablet by mouth daily.         OBJECTIVE:   Vital Signs: BP 120/80 (BP Location: Left Arm, Patient Position: Sitting, Cuff Size: Small)   Pulse 80   Ht '5\' 5"'$  (1.651 m)   Wt 167 lb (75.8 kg)   SpO2 95%   BMI 27.79 kg/m   Wt Readings from Last 3 Encounters:  02/25/22 167 lb (75.8 kg)  02/23/21 162 lb (73.5  kg)  02/18/21 162 lb (73.5 kg)     Exam: General: Pt appears well and is in NAD  Neck: General: Supple without adenopathy. Thyroid: Thyroid size normal.  No goiter or nodules appreciated.   Lungs: Clear with good BS bilat   Heart: RRR   Abdomen:  soft, nontender  Extremities: No pretibial edema.   Neuro: MS is good with appropriate affect, pt is alert and Ox3    DM foot exam: 02/25/2022  The skin of the feet is intact without sores or ulcerations. The pedal pulses are 2+ on right and 2+ on left. The sensation is intact to a screening 5.07, 10 gram monofilament bilaterally     DATA REVIEWED:  Lab Results  Component Value Date   HGBA1C 6.5 (A) 02/25/2022   HGBA1C 6.7 (A) 02/18/2021   HGBA1C 6.4 (A) 03/04/2020   Lab Results  Component Value Date   LDLCALC 114 (H) 02/25/2020   CREATININE 1.34 (H) 05/26/2020   No results found for: "MICRALBCREAT"   Lab Results  Component Value Date   CHOL 182 02/25/2020   HDL 34 (L) 02/25/2020   LDLCALC 114 (H) 02/25/2020   TRIG 225 (H) 02/25/2020   CHOLHDL 5.4 (H) 02/25/2020         ASSESSMENT / PLAN / RECOMMENDATIONS:   1) Type 2 Diabetes Mellitus, Optimally controlled, Without complications - Most recent A1c of 6.5 %. Goal A1c < 7.0 %.    -Glucose control is optimal -Patient endorses hypoglycemia at night in the past, but this has resolved since discontinuation of atorvastatin, patient advised to contact us with hypoglycemia so we can adjust repaglinide -I have advised the patient to take his morning glycemic agents before breakfast and to continue taking repaglinide before supper -BMP has been done through PCPs office with normal GFR 12/2021   MEDICATIONS: Continue metformin 500 mg XR, 1 tablet before breakfast Continue repaglinide 1 mg, 1 tablet before supper Continue pioglitazone 45 mg, 1 tablet before breakfast  EDUCATION / INSTRUCTIONS: BG monitoring instructions: Patient is instructed to check his blood sugars 2-3  times a week. Call [facility] if: BG persistently < 70  I reviewed the Rule of 15 for the treatment of hypoglycemia in detail with the patient. Literature supplied.    2) Diabetic complications:  Eye: Does not have known diabetic retinopathy.  Neuro/ Feet: Does not have known diabetic peripheral neuropathy .  Renal: Patient does not have known baseline CKD. He   is not on an ACEI/ARB at present.    3) Dyslipidemia   - LDL is above goal 12/2021 at 129 mg/DL -Used to be on atorvastatin, but this was  discontinued by Dr. Loanne Holmes due to variable glucose reading, and patient attributing hypoglycemia to atorvastatin, I did explain to the patient that atorvastatin has been associated with hyperglycemia rather than hypoglycemia -He was recently started on rosuvastatin by his PCP - Discussed cardiovascular benefits of statins therapy  4) knee pain:  -We discussed differential diagnosis to include osteoarthritis versus side effect from his medications specifically statin therapy given that that this is the newest addition -Will defer to PCP  F/U in 6 months   Signed electronically by: Mack Guise, MD  [facility] End[facility]ology  [facility] [address] [place], [place] [address] Phone: [phone] FAX: [phone]   CC: Orpah Melter, [place] [address] Phone: [phone]  Fax: [phone]  Return to Endocrinology clinic as below: Future Appointments  Date Time Provider [place]  09/06/2022  2:40 PM Aiman Noe, Melanie Crazier, MD [facility] None

## 2022-04-05 ENCOUNTER — Other Ambulatory Visit: Payer: Self-pay | Admitting: *Deleted

## 2022-04-05 ENCOUNTER — Telehealth: Payer: Self-pay | Admitting: *Deleted

## 2022-04-05 DIAGNOSIS — E119 Type 2 diabetes mellitus without complications: Secondary | ICD-10-CM

## 2022-04-05 MED ORDER — METFORMIN HCL ER 500 MG PO TB24: 500.0000 mg | ORAL_TABLET | Freq: Every day | ORAL | 3 refills | Status: DC

## 2022-04-05 MED ORDER — ONETOUCH VERIO VI STRP: 1.0000 | ORAL_STRIP | Freq: Every day | 3 refills | Status: DC

## 2022-04-05 MED ORDER — REPAGLINIDE 1 MG PO TABS: 1.0000 mg | ORAL_TABLET | Freq: Every day | ORAL | 3 refills | Status: DC

## 2022-04-05 MED ORDER — PIOGLITAZONE HCL 45 MG PO TABS: 45.0000 mg | ORAL_TABLET | Freq: Every day | ORAL | 3 refills | Status: DC

## 2022-09-06 ENCOUNTER — Ambulatory Visit: Payer: Self-pay | Admitting: Internal Medicine

## 2022-12-20 ENCOUNTER — Ambulatory Visit: Payer: Self-pay | Admitting: Internal Medicine

## 2023-02-07 ENCOUNTER — Telehealth: Payer: Self-pay

## 2023-02-07 ENCOUNTER — Other Ambulatory Visit (HOSPITAL_COMMUNITY): Payer: Self-pay

## 2023-02-07 ENCOUNTER — Other Ambulatory Visit: Payer: Self-pay | Admitting: Family Medicine

## 2023-02-07 DIAGNOSIS — H814 Vertigo of central origin: Secondary | ICD-10-CM

## 2023-02-07 NOTE — Telephone Encounter (Signed)
 Pharmacy Patient Advocate Encounter  Insurance verification completed.   The patient is insured through [facility]   Ran test claim for Dovato . Currently a quantity of 30 is a 30 day supply and the co-pay is $2,912.63. [place] makes the the copay $0.00 [place]   $[phone] [place] need a PA.  This test claim was processed through [facility]- copay amounts may vary at other pharmacies due to pharmacy/plan contracts, or as the patient moves through the different stages of their insurance plan.

## 2023-02-08 ENCOUNTER — Encounter: Payer: No Typology Code available for payment source | Admitting: Infectious Diseases

## 2023-02-08 ENCOUNTER — Ambulatory Visit: Payer: BC Managed Care – PPO | Admitting: Internal Medicine

## 2023-02-09 ENCOUNTER — Encounter: Payer: Self-pay | Admitting: Family Medicine

## 2023-02-13 ENCOUNTER — Ambulatory Visit
Admission: RE | Admit: 2023-02-13 | Discharge: 2023-02-13 | Disposition: A | Discharge status: Home / Self Care | Payer: No Typology Code available for payment source | Source: Ambulatory Visit | Attending: Family Medicine | Admitting: Family Medicine

## 2023-02-13 DIAGNOSIS — H814 Vertigo of central origin: Secondary | ICD-10-CM

## 2023-02-15 ENCOUNTER — Encounter: Payer: No Typology Code available for payment source | Admitting: Infectious Diseases

## 2023-02-16 ENCOUNTER — Ambulatory Visit: Payer: No Typology Code available for payment source | Admitting: Internal Medicine

## 2023-02-16 ENCOUNTER — Other Ambulatory Visit: Payer: Self-pay | Admitting: Internal Medicine

## 2023-02-16 ENCOUNTER — Encounter: Payer: Self-pay | Admitting: Internal Medicine

## 2023-02-16 VITALS — BP 116/72 | HR 72 | Ht 65.0 in | Wt 169.0 lb

## 2023-02-16 DIAGNOSIS — Z7984 Long term (current) use of oral hypoglycemic drugs: Secondary | ICD-10-CM

## 2023-02-16 DIAGNOSIS — E119 Type 2 diabetes mellitus without complications: Secondary | ICD-10-CM | POA: Diagnosis not present

## 2023-02-16 LAB — POCT GLYCOSYLATED HEMOGLOBIN (HGB A1C): Hemoglobin A1C: 6.8 % — AB (ref 4.0–5.6)

## 2023-02-16 LAB — POCT GLUCOSE (DEVICE FOR HOME USE): POC Glucose: 140 mg/dL — AB (ref 70–99)

## 2023-02-16 MED ORDER — FREESTYLE LIBRE 3 PLUS SENSOR MISC: 1.0000 | 3 refills | Status: DC

## 2023-02-16 MED ORDER — JANUMET XR 50-1000 MG PO TB24: 2.0000 | ORAL_TABLET | Freq: Every day | ORAL | 3 refills | Status: DC

## 2023-02-16 MED ORDER — PIOGLITAZONE HCL 45 MG PO TABS
45.0000 mg | ORAL_TABLET | Freq: Every day | ORAL | 3 refills | Status: DC
Start: 2023-02-16 — End: 2023-07-05

## 2023-02-16 MED ORDER — FREESTYLE LIBRE 3 PLUS SENSOR MISC: 1.0000 | 3 refills | Status: AC

## 2023-02-16 MED ORDER — PIOGLITAZONE HCL 45 MG PO TABS: 45.0000 mg | ORAL_TABLET | Freq: Every day | ORAL | 3 refills | Status: DC

## 2023-02-16 NOTE — Patient Instructions (Signed)
Take Janumet 50-1000 mg XR, TWO tablets daily  STOP  Repaglinide 1 mg,, 1 tablet before supper Continue Pioglitazone 45 mg, 1 tablet before breakfast daily     HOW TO TREAT LOW BLOOD SUGARS (Blood sugar LESS THAN 70 MG/DL) Please follow the RULE OF 15 for the treatment of hypoglycemia treatment (when your (blood sugars are less than 70 mg/dL)   STEP 1: Take 15 grams of carbohydrates when your blood sugar is low, which includes:  3-4 GLUCOSE TABS  OR 3-4 OZ OF JUICE OR REGULAR SODA OR ONE TUBE OF GLUCOSE GEL    STEP 2: RECHECK blood sugar in 15 MINUTES STEP 3: If your blood sugar is still low at the 15 minute recheck --> then, go back to STEP 1 and treat AGAIN with another 15 grams of carbohydrates.

## 2023-02-16 NOTE — Progress Notes (Signed)
Name: Jordan Holmes  Age/ Sex: 63 y.o., male   MRN/ DOB: [id number], 1960/03/27     PCP: Joycelyn Rua, MD   Reason for Endocrinology Evaluation: Type 2 Diabetes Mellitus  Initial Endocrine Consultative Visit: 03/30/2016    PATIENT IDENTIFIER: Jordan Holmes is a 63 y.o. male with a past medical history of DM, HIV and bronchiectasis . The patient has followed with Endocrinology clinic since 03/30/2016 for consultative assistance with management of his diabetes.  DIABETIC HISTORY:  Jordan Holmes was diagnosed with DM 2010, he has not been on insulin in the past . His hemoglobin A1c has ranged from 6.4% in 2022, peaking at 7.5% in 2016.  Rybelsus was not covered by insurance and alogliptin is cost prohibitive per his previus endocrinologist note    Saw Dr. Everardo All from  2018   until 02/2021 SUBJECTIVE:   During the last visit (02/25/2022): A1c 6.5%  Today (02/16/2023): Jordan Holmes is here for follow-up on diabetes management.  He has NOT been to our clinic in 12 months.  He checks his blood sugars occasionally,    He is having Vertigo that started intermittently for a few years but he had a recurrence recently ~2 weeks ago associated with nausea or vomiting  Was seen by PCP , prescribed Meclizine but he is not any better  Has tinnitus as well   Denies constipation or diarrhea    HOME DIABETES REGIMEN:  Metformin 500 mg XR 1 tablet after breakfast  Repaglinide 1 mg with supper Pioglitazone 45 mg daily     Statin: yes ACE-I/ARB: no    METER DOWNLOAD SUMMARY: n/a   DIABETIC COMPLICATIONS: Microvascular complications:   Denies: CKD Last Eye Exam: Completed   Macrovascular complications:   Denies: CAD, CVA, PVD   HISTORY:  Past Medical History:  Past Medical History:  Diagnosis Date   Allergy    Bronchiectasis (HCC)    Diabetes mellitus    HIV infection (HCC)    Hyperlipidemia    Multiple lung nodules on CT    Seasonal allergies    Past Surgical History:   Past Surgical History:  Procedure Laterality Date   VIDEO BRONCHOSCOPY Bilateral 10/02/2015   Procedure: VIDEO BRONCHOSCOPY WITH FLUORO;  Surgeon: Roslynn Amble, MD;  Location: [facility] ENDOSCOPY;  Service: Cardiopulmonary;  Laterality: Bilateral;   Social History:  reports that he has never smoked. He has never used smokeless tobacco. He reports that he does not drink alcohol and does not use drugs. Family History:  Family History  Problem Relation Age of Onset   Lung disease Neg Hx    Rheumatologic disease Neg Hx    Diabetes Neg Hx      HOME MEDICATIONS: Allergies as of 02/16/2023       Reactions   Amoxicillin Nausea And Vomiting   Fever and vomiting Diarrhea & fever        Medication List        Accurate as of February 16, 2023 11:45 AM. If you have any questions, ask your nurse or doctor.          dolutegravir-lamiVUDine 50-300 MG tablet Commonly known as: DOVATO TAKE 1 TABLET BY MOUTH DAILY.   Fish Oil 1200 MG Cpdr Take 12,000 mg by mouth daily.   metFORMIN 500 MG 24 hr tablet Commonly known as: GLUCOPHAGE-XR Take 1 tablet (500 mg total) by mouth daily with breakfast.   OneTouch Verio test strip Generic drug: glucose blood 1 each by Other route daily. And lancets 1/day  pioglitazone 45 MG tablet Commonly known as: ACTOS Take 1 tablet (45 mg total) by mouth daily.   repaglinide 1 MG tablet Commonly known as: PRANDIN Take 1 tablet (1 mg total) by mouth daily before supper.   rosuvastatin 5 MG tablet Commonly known as: CRESTOR Take 5 mg by mouth daily.   Vitamin D3 75 MCG (3000 UT) Tabs Take 1 tablet by mouth daily.         OBJECTIVE:   Vital Signs: BP 116/72 (BP Location: Left Arm, Patient Position: Sitting, Cuff Size: Normal)   Pulse 72   Ht 5\' 5"  (1.651 m)   Wt 169 lb (76.7 kg)   SpO2 99%   BMI 28.12 kg/m   Wt Readings from Last 3 Encounters:  02/16/23 169 lb (76.7 kg)  02/25/22 167 lb (75.8 kg)  02/23/21 162 lb (73.5 kg)      Exam: General: Pt appears well and is in NAD  Lungs: Clear with good BS bilat   Heart: RRR   Abdomen:  soft, nontender  Extremities: No pretibial edema.   Neuro: MS is good with appropriate affect, pt is alert and Ox3    DM foot exam: 02/16/2023  The skin of the feet is intact without sores or ulcerations. The pedal pulses are 2+ on right and 2+ on left. The sensation is intact to a screening 5.07, 10 gram monofilament bilaterally     DATA REVIEWED:  Lab Results  Component Value Date   HGBA1C 6.5 (A) 02/25/2022   HGBA1C 6.7 (A) 02/18/2021   HGBA1C 6.4 (A) 03/04/2020   12/23/2022 BUN 11 CR 1.340 GFR 66 HDL 31 LDL 129 TG 139  In office BG 140 Mg/DL  ASSESSMENT / PLAN / RECOMMENDATIONS:   1) Type 2 Diabetes Mellitus, Optimally controlled, Without complications - Most recent A1c of 6.8 %. Goal A1c < 7.0 %.    -Glucose control is optimal -Labs done through PCP were reviewed -Patient is requesting CGM, freestyle libre prescription was sent to the pharmacy and he was trained by my CMA on use -Patient used to be on Janumet and would like to go back on it, patient indicated that when he was on Janumet this was the only medication he needed.  I did explain to the patient that I cannot discontinue repaglinide and pioglitazone at this time but we can gradually decrease medications as we monitor his A1c -Will discontinue repaglinide   MEDICATIONS: Switch metformin to Janumet 50-1000 XR, 2 tabs daily Stop repaglinide 1 mg, 1 tablet before supper Continue pioglitazone 45 mg, 1 tablet before breakfast  EDUCATION / INSTRUCTIONS: BG monitoring instructions: Patient is instructed to check his blood sugars 2-3 times a week. Call [place] [facility] if: BG persistently < 70  I reviewed the Rule of 15 for the treatment of hypoglycemia in detail with the patient. Literature supplied.    2) Diabetic complications:  Eye: Does not have known diabetic retinopathy.   Neuro/ Feet: Does not have known diabetic peripheral neuropathy .  Renal: Patient does not have known baseline CKD. He   is not on an ACEI/ARB at present.    3) Dyslipidemia   - LDL is above goal 12/2021 and 12/2022  at 129 mg/DL -Last year he told me that his PCP started him on rosuvastatin, but it appears he discontinued this and was restarted again this year -I did emphasize the importance of taking statin therapy to reduce the cardiovascular and CVA risk   Medication Continue rosuvastatin 5 mg  daily    F/U in 6 months   Signed electronically by: Lyndle Herrlich, MD  [facility] Endocrinology  [facility] [address] [place] [place], [place] [address] Phone: [phone] FAX: [phone]   CC: Joycelyn Rua, MD [address] [place] [address] Phone: [phone]  Fax: [phone]  Return to Endocrinology clinic as below: Future Appointments  Date Time Provider Department Center  02/21/2023  3:30 PM Ginnie Smart, MD [facility]

## 2023-02-21 ENCOUNTER — Other Ambulatory Visit (HOSPITAL_COMMUNITY)
Admission: RE | Admit: 2023-02-21 | Discharge: 2023-02-21 | Disposition: A | Discharge status: Home / Self Care | Payer: No Typology Code available for payment source | Source: Ambulatory Visit | Attending: Infectious Diseases | Admitting: Infectious Diseases

## 2023-02-21 ENCOUNTER — Ambulatory Visit: Payer: No Typology Code available for payment source | Admitting: Infectious Diseases

## 2023-02-21 VITALS — BP 107/71 | HR 78 | Temp 97.4°F | Ht 65.0 in | Wt 170.2 lb

## 2023-02-21 DIAGNOSIS — B20 Human immunodeficiency virus [HIV] disease: Secondary | ICD-10-CM

## 2023-02-21 DIAGNOSIS — R42 Dizziness and giddiness: Secondary | ICD-10-CM | POA: Diagnosis not present

## 2023-02-21 DIAGNOSIS — E785 Hyperlipidemia, unspecified: Secondary | ICD-10-CM | POA: Diagnosis not present

## 2023-02-21 DIAGNOSIS — Z113 Encounter for screening for infections with a predominantly sexual mode of transmission: Secondary | ICD-10-CM | POA: Insufficient documentation

## 2023-02-21 DIAGNOSIS — E119 Type 2 diabetes mellitus without complications: Secondary | ICD-10-CM

## 2023-02-21 DIAGNOSIS — E781 Pure hyperglyceridemia: Secondary | ICD-10-CM | POA: Diagnosis not present

## 2023-02-21 DIAGNOSIS — H9311 Tinnitus, right ear: Secondary | ICD-10-CM

## 2023-02-21 LAB — URINE CYTOLOGY ANCILLARY ONLY
Chlamydia: NEGATIVE
Comment: NEGATIVE
Comment: NORMAL
Neisseria Gonorrhea: NEGATIVE

## 2023-02-21 NOTE — Assessment & Plan Note (Signed)
He has had some relief with meclizine Will send him back to ENT.  He has tried exercises for his ear fluid canals as well.

## 2023-02-21 NOTE — Assessment & Plan Note (Addendum)
He seems well off art Will check his labs today.  Offered PCV 20, he has had in the last year.  Will defer for 5 years.  Will see him back in 3 months.  Will hold on giving him dovato samples today.  Offered/refused condoms.

## 2023-02-21 NOTE — Progress Notes (Signed)
Subjective:    Patient ID: Jordan Holmes, male  DOB: Jan 23, 1960, 63 y.o.        MRN: [id number]   HPI 63 yo M with hx of HIV+ since 2016. He was previously followed by Dr Orvan Falconer. He was previously on biktarvy -->dovato but unable to continue due to his health insurance having poor medication coverage. His last visit was 2023. He was off his meds since his last visit.  He also has DM2 and vertigo.He has followed with Dr Everardo All prior.  He had episode of vertigo in the last 3 weeks which has persisted. Was previous seen by ENT. His episodes previously improved with meclizine. Dramamine has not helped.  He is currently taking meclizine from his PCP. His PCP also rec MRI which he had done which was (-) as a cause of his vertigo.    Had Ophtho 12-2022 Got flu vax lat fall. COVID last spring.  No Tobacco, rare ETOH.  FSG run around 120-150.   Would like repeat labs done today.    HIV 1 RNA Quant (Copies/mL)  Date Value  02/23/2021 78,900 (H)  05/26/2020 25 (H)  02/25/2020 <20 (H)   CD4 T Cell Abs (/uL)  Date Value  05/26/2020 590  02/25/2020 606  01/23/2019 632     Health Maintenance  Topic Date Due  . Diabetic kidney evaluation - Urine ACR  Never done  . Colonoscopy  Never done  . Pneumococcal Vaccine 35-11 Years old (2 of 2 - PCV) 11/16/2011  . OPHTHALMOLOGY EXAM  09/18/2020  . Diabetic kidney evaluation - eGFR measurement  05/26/2021  . COVID-19 Vaccine (5 - 2024-25 season) 09/04/2022  . Zoster Vaccines- Shingrix (2 of 2) 10/07/2022  . HEMOGLOBIN A1C  08/16/2023  . FOOT EXAM  02/16/2024  . DTaP/Tdap/Td (4 - Td or Tdap) 05/11/2030  . INFLUENZA VACCINE  Completed  . Hepatitis C Screening  Completed  . HIV Screening  Completed  . HPV VACCINES  Aged Out      Review of Systems  Constitutional:  Negative for chills, fever and weight loss.  HENT:  Negative for congestion and sinus pain.   Eyes:  Positive for blurred vision.  Respiratory:  Negative for cough and  shortness of breath.   Cardiovascular:  Negative for chest pain and leg swelling.  Gastrointestinal:  Negative for constipation and diarrhea.  Genitourinary:  Negative for dysuria.  Neurological:  Positive for dizziness. Negative for tingling and sensory change.  Psychiatric/Behavioral:  Negative for depression. The patient does not have insomnia.    Please see HPI. All other systems reviewed and negative.     Objective:  Physical Exam Vitals reviewed.  Constitutional:      General: He is not in acute distress.    Appearance: He is not toxic-appearing.  HENT:     Right Ear: Hearing normal. There is impacted cerumen.     Left Ear: External ear normal. No drainage or swelling. Tympanic membrane is erythematous.     Mouth/Throat:     Mouth: Mucous membranes are moist.     Pharynx: No oropharyngeal exudate.  Eyes:     Extraocular Movements: Extraocular movements intact.     Pupils: Pupils are equal, round, and reactive to light.  Cardiovascular:     Rate and Rhythm: Normal rate and regular rhythm.  Pulmonary:     Effort: Pulmonary effort is normal.     Breath sounds: Normal breath sounds.  Abdominal:     General: Bowel sounds are  normal. There is no distension.     Palpations: Abdomen is soft.     Tenderness: There is no abdominal tenderness.  Musculoskeletal:        General: Normal range of motion.     Cervical back: Normal range of motion and neck supple.     Right lower leg: No edema.     Left lower leg: No edema.  Neurological:     General: No focal deficit present.     Mental Status: He is alert.  Psychiatric:        Mood and Affect: Mood normal.          Assessment & Plan:

## 2023-02-21 NOTE — Assessment & Plan Note (Signed)
He's off his medication.  Will recheck his labs.

## 2023-02-21 NOTE — Assessment & Plan Note (Signed)
Will get him back in with ENT.

## 2023-02-21 NOTE — Addendum Note (Signed)
Addended by: Zadrian Mccauley C on: 02/21/2023 02:18 PM   Modules accepted: Orders

## 2023-02-21 NOTE — Assessment & Plan Note (Signed)
He will continue to f/u with his PCP.  Will check his A1C today.

## 2023-02-21 NOTE — Assessment & Plan Note (Signed)
Off rx Will recheck his labs Encourage to exercise when his vertigo improves.

## 2023-02-22 ENCOUNTER — Telehealth: Payer: Self-pay | Admitting: Infectious Diseases

## 2023-02-22 LAB — COMPREHENSIVE METABOLIC PANEL
ALT: 48 [IU]/L — ABNORMAL HIGH (ref 0–44)
AST: 41 [IU]/L — ABNORMAL HIGH (ref 0–40)
Albumin: 4.5 g/dL (ref 3.9–4.9)
Alkaline Phosphatase: 55 [IU]/L (ref 44–121)
BUN/Creatinine Ratio: 12 (ref 10–24)
BUN: 13 mg/dL (ref 8–27)
Bilirubin Total: 0.4 mg/dL (ref 0.0–1.2)
CO2: 24 mmol/L (ref 20–29)
Calcium: 9.3 mg/dL (ref 8.6–10.2)
Chloride: 99 mmol/L (ref 96–106)
Creatinine, Ser: 1.05 mg/dL (ref 0.76–1.27)
Globulin, Total: 3.8 g/dL (ref 1.5–4.5)
Glucose: 119 mg/dL — ABNORMAL HIGH (ref 70–99)
Potassium: 4.3 mmol/L (ref 3.5–5.2)
Sodium: 140 mmol/L (ref 134–144)
Total Protein: 8.3 g/dL (ref 6.0–8.5)
eGFR: 81 mL/min/{1.73_m2} (ref >59)

## 2023-02-22 LAB — HEMOGLOBIN A1C
Est. average glucose Bld gHb Est-mCnc: 154 mg/dL
Hgb A1c MFr Bld: 7 % — ABNORMAL HIGH (ref 4.8–5.6)

## 2023-02-22 LAB — HIV-1 RNA QUANT-NO REFLEX-BLD
HIV-1 RNA Viral Load Log: 5.727 {Log}
HIV-1 RNA Viral Load: 533000 {copies}/mL

## 2023-02-22 LAB — LIPID PANEL
Chol/HDL Ratio: 4.6 {ratio} (ref 0.0–5.0)
Cholesterol, Total: 132 mg/dL (ref 100–199)
HDL: 29 mg/dL — ABNORMAL LOW (ref >39)
LDL Chol Calc (NIH): 71 mg/dL (ref 0–99)
Triglycerides: 186 mg/dL — ABNORMAL HIGH (ref 0–149)
VLDL Cholesterol Cal: 32 mg/dL (ref 5–40)

## 2023-02-22 LAB — CBC
Hematocrit: 46.3 % (ref 37.5–51.0)
Hemoglobin: 14.3 g/dL (ref 13.0–17.7)
MCH: 23.4 pg — ABNORMAL LOW (ref 26.6–33.0)
MCHC: 30.9 g/dL — ABNORMAL LOW (ref 31.5–35.7)
MCV: 76 fL — ABNORMAL LOW (ref 79–97)
Platelets: 221 10*3/uL (ref 150–450)
RBC: 6.12 x10E6/uL — ABNORMAL HIGH (ref 4.14–5.80)
RDW: 16.9 % — ABNORMAL HIGH (ref 11.6–15.4)
WBC: 5.3 10*3/uL (ref 3.4–10.8)

## 2023-02-22 LAB — RPR: RPR Ser Ql: NONREACTIVE

## 2023-02-22 LAB — T-HELPER CELLS (CD4) COUNT (NOT AT ARMC)
CD4 % Helper T Cell: 10 % — ABNORMAL LOW (ref 33–65)
CD4 T Cell Abs: 152 /uL — ABNORMAL LOW (ref 400–1790)

## 2023-02-22 NOTE — Telephone Encounter (Signed)
Called and left VM about his labs.  He needs to restart his dovato and also bactrim  Will call him again tomorrow

## 2023-02-23 NOTE — Telephone Encounter (Signed)
Left VM to get pt started on medications (dovato, bactrim).

## 2023-02-24 ENCOUNTER — Telehealth: Payer: Self-pay | Admitting: Infectious Diseases

## 2023-02-24 DIAGNOSIS — B20 Human immunodeficiency virus [HIV] disease: Secondary | ICD-10-CM

## 2023-02-24 MED ORDER — SULFAMETHOXAZOLE-TRIMETHOPRIM 800-160 MG PO TABS: 1.0000 | ORAL_TABLET | ORAL | 2 refills | Status: AC

## 2023-02-24 NOTE — Telephone Encounter (Signed)
Spoke with pt Sent in bactrim prophylaxis Will arrange with clinic for him to get dovato samples Will work on ADAP for him Have him rtc in 1 months.

## 2023-02-28 ENCOUNTER — Other Ambulatory Visit (HOSPITAL_COMMUNITY): Payer: Self-pay

## 2023-02-28 ENCOUNTER — Other Ambulatory Visit: Payer: Self-pay | Admitting: Infectious Diseases

## 2023-02-28 ENCOUNTER — Telehealth: Payer: Self-pay

## 2023-02-28 DIAGNOSIS — B20 Human immunodeficiency virus [HIV] disease: Secondary | ICD-10-CM

## 2023-02-28 DIAGNOSIS — R42 Dizziness and giddiness: Secondary | ICD-10-CM

## 2023-02-28 NOTE — Telephone Encounter (Signed)
 Pt  is requesting a call back .Marland Kitchen He stated she spoke with Dr Ninetta Lights who informed  him that you can get him some samples

## 2023-02-28 NOTE — Telephone Encounter (Signed)
 Call from patient asking for refill on Dovato.  Patient asked for samples.  No samples available at this time will forward to [employer] for [facility].

## 2023-03-02 ENCOUNTER — Other Ambulatory Visit (HOSPITAL_COMMUNITY): Payer: Self-pay

## 2023-03-03 NOTE — Telephone Encounter (Signed)
 Patient called to check on Jordan Holmes status.   Informed patient the medication is no charge and he would like [place] to send a new RX to [facility] in [facility] [address]. Patient is out of medication and would like this as soon as possible.   Patient also says he was supposed to get an ENT referral from Dr. Ninetta Lights. He would like to get an update on this as soon as possible.

## 2023-03-06 ENCOUNTER — Other Ambulatory Visit: Payer: Self-pay | Admitting: Infectious Diseases

## 2023-03-06 DIAGNOSIS — B20 Human immunodeficiency virus [HIV] disease: Secondary | ICD-10-CM

## 2023-03-06 MED ORDER — DOLUTEGRAVIR-LAMIVUDINE 50-300 MG PO TABS: 1.0000 | ORAL_TABLET | Freq: Every day | ORAL | 3 refills | Status: AC

## 2023-03-07 ENCOUNTER — Encounter (INDEPENDENT_AMBULATORY_CARE_PROVIDER_SITE_OTHER): Payer: Self-pay | Admitting: Otolaryngology

## 2023-03-29 ENCOUNTER — Encounter: Admitting: Infectious Diseases

## 2023-04-08 ENCOUNTER — Other Ambulatory Visit: Payer: Self-pay | Admitting: Internal Medicine

## 2023-04-08 DIAGNOSIS — E119 Type 2 diabetes mellitus without complications: Secondary | ICD-10-CM

## 2023-04-10 ENCOUNTER — Other Ambulatory Visit: Payer: Self-pay | Admitting: Internal Medicine

## 2023-04-10 DIAGNOSIS — E119 Type 2 diabetes mellitus without complications: Secondary | ICD-10-CM

## 2023-04-13 ENCOUNTER — Other Ambulatory Visit: Payer: Self-pay | Admitting: Internal Medicine

## 2023-04-13 DIAGNOSIS — E119 Type 2 diabetes mellitus without complications: Secondary | ICD-10-CM

## 2023-04-13 MED ORDER — SAXAGLIPTIN-METFORMIN ER 2.5-1000 MG PO TB24: 2.0000 | ORAL_TABLET | Freq: Every day | ORAL | 3 refills | Status: DC

## 2023-05-02 ENCOUNTER — Encounter: Admitting: Infectious Diseases

## 2023-05-23 ENCOUNTER — Encounter: Admitting: Infectious Diseases

## 2023-06-12 ENCOUNTER — Institutional Professional Consult (permissible substitution) (INDEPENDENT_AMBULATORY_CARE_PROVIDER_SITE_OTHER): Admitting: Otolaryngology

## 2023-06-16 ENCOUNTER — Ambulatory Visit: Payer: No Typology Code available for payment source | Admitting: Internal Medicine

## 2023-06-20 ENCOUNTER — Other Ambulatory Visit: Payer: Self-pay | Admitting: Internal Medicine

## 2023-06-20 ENCOUNTER — Encounter: Admitting: Infectious Diseases

## 2023-06-20 DIAGNOSIS — E119 Type 2 diabetes mellitus without complications: Secondary | ICD-10-CM

## 2023-06-21 ENCOUNTER — Other Ambulatory Visit: Payer: Self-pay | Admitting: Internal Medicine

## 2023-06-21 ENCOUNTER — Ambulatory Visit: Admitting: Infectious Diseases

## 2023-06-21 VITALS — BP 101/71 | HR 74 | Ht 65.0 in | Wt 162.6 lb

## 2023-06-21 DIAGNOSIS — Z113 Encounter for screening for infections with a predominantly sexual mode of transmission: Secondary | ICD-10-CM

## 2023-06-21 DIAGNOSIS — E119 Type 2 diabetes mellitus without complications: Secondary | ICD-10-CM

## 2023-06-21 DIAGNOSIS — B20 Human immunodeficiency virus [HIV] disease: Secondary | ICD-10-CM

## 2023-06-21 DIAGNOSIS — Z79899 Other long term (current) drug therapy: Secondary | ICD-10-CM

## 2023-06-21 NOTE — Progress Notes (Signed)
 Rschedule due to not being labs in advance, pt preference.

## 2023-07-03 ENCOUNTER — Ambulatory Visit: Admitting: Internal Medicine

## 2023-07-03 NOTE — Progress Notes (Deleted)
 Name: Jordan Holmes  Age/ Sex: 63 y.o., male   MRN/ DOB: [id number], [birth year]     PCP: Nanci Senior, MD   Reason for Endocrinology Evaluation: Type 2 Diabetes Mellitus  Initial Endocrine Consultative Visit: 03/30/2016    PATIENT IDENTIFIER: Jordan Holmes is a 63 y.o. male with a past medical history of DM, HIV and bronchiectasis . The patient has followed with Endocrinology clinic since 03/30/2016 for consultative assistance with management of his diabetes.  DIABETIC HISTORY:  Jordan Holmes was diagnosed with DM 2010, he has not been on insulin in the past . His hemoglobin A1c has ranged from 6.4% in 2022, peaking at 7.5% in 2016.  Rybelsus  was not covered by insurance and alogliptin  is cost prohibitive per his previus endocrinologist note    Saw Dr. Kassie from  2018   until 02/2021  Switched metformin  to Janumet  per his request 02/2023  SUBJECTIVE:   During the last visit (02/16/2023): A1c 6.8%  Today (07/03/2023): Jordan Holmes is here for follow-up on diabetes management. He checks his blood sugars occasionally,   He continues to follow-up with Dr. Eben for HIV positive He was evaluated by Dr. Jesus (ENT) for vertigo, he underwent oratory evaluation   Denies constipation or diarrhea    HOME DIABETES REGIMEN:  Janumet  2.05-998 mg , 2 tabs daily  Pioglitazone  45 mg daily     Statin: yes ACE-I/ARB: no    METER DOWNLOAD SUMMARY: n/a   DIABETIC COMPLICATIONS: Microvascular complications:   Denies: CKD Last Eye Exam: Completed   Macrovascular complications:   Denies: CAD, CVA, PVD   HISTORY:  Past Medical History:  Past Medical History:  Diagnosis Date   Allergy    Bronchiectasis (HCC)    Diabetes mellitus    HIV infection (HCC)    Hyperlipidemia    Multiple lung nodules on CT    Seasonal allergies    Past Surgical History:  Past Surgical History:  Procedure Laterality Date   VIDEO BRONCHOSCOPY Bilateral 10/02/2015   Procedure: VIDEO  BRONCHOSCOPY WITH FLUORO;  Surgeon: Tonnie FORBES Flicker, MD;  Location: [facility] ENDOSCOPY;  Service: Cardiopulmonary;  Laterality: Bilateral;   Social History:  reports that he has never smoked. He has never used smokeless tobacco. He reports that he does not drink alcohol and does not use drugs. Family History:  Family History  Problem Relation Age of Onset   Lung disease Neg Hx    Rheumatologic disease Neg Hx    Diabetes Neg Hx      HOME MEDICATIONS: Allergies as of 07/03/2023       Reactions   Amoxicillin Nausea And Vomiting   Fever and vomiting Diarrhea & fever        Medication List        Accurate as of July 03, 2023 12:50 PM. If you have any questions, ask your nurse or doctor.          dolutegravir -lamiVUDine  50-300 MG tablet Commonly known as: DOVATO  TAKE 1 TABLET BY MOUTH DAILY.   Fish Oil 1200 MG Cpdr Take 12,000 mg by mouth daily.   FreeStyle Libre 3 Plus Sensor Misc 1 Device by Other route as directed. Change sensor every 15 days.   OneTouch Verio test strip Generic drug: glucose blood 1 each by Other route daily. And lancets 1/day   pioglitazone  45 MG tablet Commonly known as: ACTOS  Take 1 tablet (45 mg total) by mouth daily.   Saxagliptin -Metformin  2.05-998 MG Tb24 Take 2 tablets by mouth daily in the  afternoon.   sulfamethoxazole -trimethoprim  800-160 MG tablet Commonly known as: Bactrim  DS Take 1 tablet by mouth 3 (three) times a week.   Vitamin D3 75 MCG (3000 UT) Tabs Take 1 tablet by mouth daily.         OBJECTIVE:   Vital Signs: There were no vitals taken for this visit.  Wt Readings from Last 3 Encounters:  06/21/23 162 lb 9.6 oz (73.8 kg)  02/21/23 170 lb 3.2 oz (77.2 kg)  02/16/23 169 lb (76.7 kg)     Exam: General: Pt appears well and is in NAD  Lungs: Clear with good BS bilat   Heart: RRR   Abdomen:  soft, nontender  Extremities: No pretibial edema.   Neuro: MS is good with appropriate affect, pt is alert and Ox3     DM foot exam: 02/16/2023  The skin of the feet is intact without sores or ulcerations. The pedal pulses are 2+ on right and 2+ on left. The sensation is intact to a screening 5.07, 10 gram monofilament bilaterally     DATA REVIEWED:  Lab Results  Component Value Date   HGBA1C 7.0 (H) 02/21/2023   HGBA1C 6.8 (A) 02/16/2023   HGBA1C 6.5 (A) 02/25/2022    Latest Reference Range & Units 02/21/23 14:16  Sodium 134 - 144 mmol/L 140  Potassium 3.5 - 5.2 mmol/L 4.3  Chloride 96 - 106 mmol/L 99  CO2 20 - 29 mmol/L 24  Glucose 70 - 99 mg/dL 880 (H)  BUN 8 - 27 mg/dL 13  Creatinine 9.23 - 8.72 mg/dL 8.94  Calcium  8.6 - 10.2 mg/dL 9.3  BUN/Creatinine Ratio 10 - 24  12  eGFR >59 mL/min/1.73 81  Alkaline Phosphatase 44 - 121 IU/L 55  Albumin 3.9 - 4.9 g/dL 4.5  AST 0 - 40 IU/L 41 (H)  ALT 0 - 44 IU/L 48 (H)  Total Protein 6.0 - 8.5 g/dL 8.3  Total Bilirubin 0.0 - 1.2 mg/dL 0.4  Total CHOL/HDL Ratio 0.0 - 5.0 ratio 4.6  Cholesterol, Total 100 - 199 mg/dL 867  HDL Cholesterol >60 mg/dL 29 (L)  Triglycerides 0 - 149 mg/dL 813 (H)  VLDL Cholesterol Cal 5 - 40 mg/dL 32  LDL Chol Calc (NIH) 0 - 99 mg/dL 71    ASSESSMENT / PLAN / RECOMMENDATIONS:   1) Type 2 Diabetes Mellitus, Optimally controlled, Without complications - Most recent A1c of 6.8 %. Goal A1c < 7.0 %.    -Glucose control is optimal -Labs done through PCP were reviewed -Patient is requesting CGM, freestyle libre prescription was sent to the pharmacy and he was trained by my CMA on use -Patient used to be on Janumet  and would like to go back on it, patient indicated that when he was on Janumet  this was the only medication he needed.  I did explain to the patient that I cannot discontinue repaglinide  and pioglitazone  at this time but we can gradually decrease medications as we monitor his A1c -Will discontinue repaglinide    MEDICATIONS: Switch metformin  to Janumet  50-1000 XR, 2 tabs daily Stop repaglinide  1 mg, 1  tablet before supper Continue pioglitazone  45 mg, 1 tablet before breakfast  EDUCATION / INSTRUCTIONS: BG monitoring instructions: Patient is instructed to check his blood sugars 2-3 times a week. Call [place] [facility] clinic if: BG persistently < 70  I reviewed the Rule of 15 for the treatment of hypoglycemia in detail with the patient. Literature supplied.    2) Diabetic complications:  Eye: Does not have  known diabetic retinopathy.  Neuro/ Feet: Does not have known diabetic peripheral neuropathy .  Renal: Patient does not have known baseline CKD. He   is not on an ACEI/ARB at present.    3) Dyslipidemia   - LDL is above goal 12/2021 and 12/2022  at 129 mg/DL -Last year he told me that his PCP started him on rosuvastatin, but it appears he discontinued this and was restarted again this year -I did emphasize the importance of taking statin therapy to reduce the cardiovascular and CVA risk   Medication Continue rosuvastatin 5 mg daily    F/U in 6 months   Signed electronically by: Stefano Redgie Butts, MD  [facility] Endocrinology  [facility] [address] [place] [place], [place] [address] Phone: [phone] FAX: [phone]   CC: Nanci Senior, MD [address] [place] [address] Phone: [phone]  Fax: [phone]  Return to Endocrinology clinic as below: Future Appointments  Date Time Provider Department Center  07/03/2023  3:00 PM Luther Newhouse, Donell Redgie, MD [id number] None  07/05/2023  2:45 PM Eben Reyes BROCKS, MD [facility]

## 2023-07-05 ENCOUNTER — Encounter: Payer: Self-pay | Admitting: Internal Medicine

## 2023-07-05 ENCOUNTER — Other Ambulatory Visit: Payer: Self-pay

## 2023-07-05 ENCOUNTER — Ambulatory Visit (INDEPENDENT_AMBULATORY_CARE_PROVIDER_SITE_OTHER): Admitting: Internal Medicine

## 2023-07-05 ENCOUNTER — Ambulatory Visit: Admitting: Infectious Diseases

## 2023-07-05 VITALS — BP 110/72 | HR 83 | Ht 65.0 in | Wt 161.8 lb

## 2023-07-05 VITALS — BP 104/67 | HR 74 | Temp 97.9°F | Resp 32 | Ht 65.0 in | Wt 161.2 lb

## 2023-07-05 DIAGNOSIS — Z113 Encounter for screening for infections with a predominantly sexual mode of transmission: Secondary | ICD-10-CM

## 2023-07-05 DIAGNOSIS — B20 Human immunodeficiency virus [HIV] disease: Secondary | ICD-10-CM | POA: Diagnosis not present

## 2023-07-05 DIAGNOSIS — Z79899 Other long term (current) drug therapy: Secondary | ICD-10-CM

## 2023-07-05 DIAGNOSIS — E119 Type 2 diabetes mellitus without complications: Secondary | ICD-10-CM

## 2023-07-05 DIAGNOSIS — M1712 Unilateral primary osteoarthritis, left knee: Secondary | ICD-10-CM | POA: Diagnosis not present

## 2023-07-05 DIAGNOSIS — Z7984 Long term (current) use of oral hypoglycemic drugs: Secondary | ICD-10-CM

## 2023-07-05 DIAGNOSIS — H9311 Tinnitus, right ear: Secondary | ICD-10-CM

## 2023-07-05 DIAGNOSIS — E785 Hyperlipidemia, unspecified: Secondary | ICD-10-CM

## 2023-07-05 LAB — POCT GLYCOSYLATED HEMOGLOBIN (HGB A1C): Hemoglobin A1C: 6 % — AB (ref 4.0–5.6)

## 2023-07-05 LAB — POCT GLUCOSE (DEVICE FOR HOME USE): POC Glucose: 179 mg/dL — AB (ref 70–99)

## 2023-07-05 MED ORDER — SAXAGLIPTIN-METFORMIN ER 2.5-1000 MG PO TB24: 2.0000 | ORAL_TABLET | Freq: Every day | ORAL | 3 refills | Status: AC

## 2023-07-05 NOTE — Progress Notes (Signed)
 Subjective:    Patient ID: Jordan Holmes, male  DOB: 09/22/1960, 63 y.o.        MRN: [id number]   HPI 63 yo M with hx of HIV+ since 2016. He was previously followed by Dr Elaine. He was previously on biktarvy  -->dovato  but unable to continue due to his health insurance having poor medication coverage. He was off his meds for ~ 1 year.  He also has DM2 and vertigo.He has followed with Dr Kassie prior.  He had episode of vertigo in the last 3 weeks which has persisted. Was previous seen by ENT. His episodes previously improved with meclizine. Dramamine has not helped.  He is currently taking meclizine from his PCP. His PCP also rec MRI which he had done which was (-) as a cause of his vertigo.   This has improved but still mild in R ear.    Had Ophtho 12-2022 Got flu vax lat fall. COVID last spring.  No Tobacco, rare ETOH.  FSG run around 110-130 AM fasting. A1C 6.0% at his DM provider.   Has lost 10 # intentionally. He would like to lose more, due to arthritis. Hard to exercise due to knee pain.   HIV 1 RNA Quant (Copies/mL)  Date Value  02/23/2021 78,900 (H)  05/26/2020 25 (H)  02/25/2020 <20 (H)   HIV-1 RNA Viral Load (copies/mL)  Date Value  02/21/2023 533,000   CD4 T Cell Abs (/uL)  Date Value  02/21/2023 152 (L)  05/26/2020 590  02/25/2020 606     Health Maintenance  Topic Date Due  . Diabetic kidney evaluation - Urine ACR  Never done  . Colonoscopy  Never done  . OPHTHALMOLOGY EXAM  09/18/2020  . Hepatitis B Vaccines (1 of 3 - Risk 3-dose series) Never done  . COVID-19 Vaccine (6 - 2024-25 season) 09/04/2022  . Zoster Vaccines- Shingrix (2 of 2) 10/07/2022  . Pneumococcal Vaccine 42-56 Years old (2 of 2 - PCV) 01/04/2023  . INFLUENZA VACCINE  08/04/2023  . HEMOGLOBIN A1C  01/05/2024  . Diabetic kidney evaluation - eGFR measurement  02/21/2024  . FOOT EXAM  07/04/2024  . DTaP/Tdap/Td (4 - Td or Tdap) 05/11/2030  . Hepatitis C Screening  Completed  . HIV  Screening  Completed  . HPV VACCINES  Aged Out  . Meningococcal B Vaccine  Aged Out      Review of Systems  Constitutional:  Negative for chills, fever and weight loss.  Eyes:  Positive for blurred vision (over last year. f/u ophtho 12-2023. possible cataract.).  Respiratory:  Negative for cough and shortness of breath.   Gastrointestinal:  Negative for constipation and diarrhea.  Genitourinary:  Negative for dysuria.  Neurological:  Negative for tingling and sensory change.  Psychiatric/Behavioral:  The patient does not have insomnia.     Please see HPI. All other systems reviewed and negative.     Objective:  Physical Exam Vitals reviewed.  Constitutional:      Appearance: Normal appearance.  HENT:     Mouth/Throat:     Mouth: Mucous membranes are moist.  Eyes:     Extraocular Movements: Extraocular movements intact.     Pupils: Pupils are equal, round, and reactive to light.  Cardiovascular:     Rate and Rhythm: Normal rate and regular rhythm.  Pulmonary:     Effort: Pulmonary effort is normal.     Breath sounds: Normal breath sounds.  Abdominal:     General: Bowel sounds are normal. There  is no distension.     Palpations: Abdomen is soft.     Tenderness: There is no abdominal tenderness.  Musculoskeletal:     Cervical back: Normal range of motion and neck supple.     Right lower leg: No edema.     Left lower leg: No edema.  Neurological:     General: No focal deficit present.     Mental Status: He is alert.  Psychiatric:        Mood and Affect: Mood normal.           Assessment & Plan:

## 2023-07-05 NOTE — Assessment & Plan Note (Addendum)
 RICE I offered to refer him to sports meds, defers for now

## 2023-07-05 NOTE — Assessment & Plan Note (Signed)
 He is doing very well Appreciate his PCP f/u.

## 2023-07-05 NOTE — Assessment & Plan Note (Addendum)
 Unchanged  Continue to f/u with ENT.

## 2023-07-05 NOTE — Assessment & Plan Note (Signed)
 He is doing well Await his repeat VL and CD4 Encouraged him about his longevity with adherence Rtc in 6 months.

## 2023-07-05 NOTE — Patient Instructions (Addendum)
 Take Saxaliptin-Metformin  2 tablets daily  HOW TO TREAT LOW BLOOD SUGARS (Blood sugar LESS THAN 70 MG/DL) Please follow the RULE OF 15 for the treatment of hypoglycemia treatment (when your (blood sugars are less than 70 mg/dL)   STEP 1: Take 15 grams of carbohydrates when your blood sugar is low, which includes:  3-4 GLUCOSE TABS  OR 3-4 OZ OF JUICE OR REGULAR SODA OR ONE TUBE OF GLUCOSE GEL    STEP 2: RECHECK blood sugar in 15 MINUTES STEP 3: If your blood sugar is still low at the 15 minute recheck --> then, go back to STEP 1 and treat AGAIN with another 15 grams of carbohydrates.

## 2023-07-05 NOTE — Progress Notes (Addendum)
 Name: Brandin Stetzer  Age/ Sex: 63 y.o., male   MRN/ DOB: [id number], 1960-06-07     PCP: Nanci Senior, MD   Reason for Endocrinology Evaluation: Type 2 Diabetes Mellitus  Initial Endocrine Consultative Visit: 03/30/2016    PATIENT IDENTIFIER: Mr. Jordan Holmes is a 63 y.o. male with a past medical history of DM, HIV and bronchiectasis . The patient has followed with Endocrinology clinic since 03/30/2016 for consultative assistance with management of his diabetes.  DIABETIC HISTORY:  Mr. Avans was diagnosed with DM 2010, he has not been on insulin in the past . His hemoglobin A1c has ranged from 6.4% in 2022, peaking at 7.5% in 2016.  Rybelsus  was not covered by insurance and alogliptin  is cost prohibitive per his previus endocrinologist note    Saw Dr. Kassie from  2018   until 02/2021  Switched metformin  to Janumet  per his request 02/2023  SUBJECTIVE:   During the last visit (02/16/2023): A1c 6.8%    Today (07/05/2023): Mr. Posten is here for follow-up on diabetes management. He checks his blood sugars occasionally. Lowerst BG 78 mg/dL    He continues to follow-up with Dr. Eben for HIV positive He was evaluated by Dr. Jesus (ENT) for vertigo, he underwent oratory evaluation  Denies nausea or vomiting  Denies constipation or diarrhea  He does have episodes of nausea, vomiting, excessive sweating  He did stop his light fifth toe approximately 10 days ago with initial swelling and pain, swelling is improving Patient is also complaining of left knee pain, worse with walking    HOME DIABETES REGIMEN:  Saxaglipitin - Metformin  2.05-998 mg 1 tablet daily  Metformin  500 mg XR, 1 tab daily  Repaglinide  1 mg before supper  Pioglitazone  45 mg daily     Statin: yes ACE-I/ARB: no    METER DOWNLOAD SUMMARY: n/a   DIABETIC COMPLICATIONS: Microvascular complications:   Denies: CKD Last Eye Exam: Completed   Macrovascular complications:   Denies: CAD, CVA,  PVD   HISTORY:  Past Medical History:  Past Medical History:  Diagnosis Date   Allergy    Bronchiectasis (HCC)    Diabetes mellitus    HIV infection (HCC)    Hyperlipidemia    Multiple lung nodules on CT    Seasonal allergies    Past Surgical History:  Past Surgical History:  Procedure Laterality Date   VIDEO BRONCHOSCOPY Bilateral 10/02/2015   Procedure: VIDEO BRONCHOSCOPY WITH FLUORO;  Surgeon: Tonnie FORBES Flicker, MD;  Location: [facility] ENDOSCOPY;  Service: Cardiopulmonary;  Laterality: Bilateral;   Social History:  reports that he has never smoked. He has never used smokeless tobacco. He reports that he does not drink alcohol and does not use drugs. Family History:  Family History  Problem Relation Age of Onset   Lung disease Neg Hx    Rheumatologic disease Neg Hx    Diabetes Neg Hx      HOME MEDICATIONS: Allergies as of 07/05/2023       Reactions   Amoxicillin Nausea And Vomiting   Fever and vomiting Diarrhea & fever        Medication List        Accurate as of July 05, 2023  2:19 PM. If you have any questions, ask your nurse or doctor.          dolutegravir -lamiVUDine  50-300 MG tablet Commonly known as: DOVATO  TAKE 1 TABLET BY MOUTH DAILY.   Fish Oil 1200 MG Cpdr Take 12,000 mg by mouth daily.   FreeStyle [employer]  3 Plus Sensor Misc 1 Device by Other route as directed. Change sensor every 15 days.   OneTouch Verio test strip Generic drug: glucose blood 1 each by Other route daily. And lancets 1/day   pioglitazone  45 MG tablet Commonly known as: ACTOS  Take 1 tablet (45 mg total) by mouth daily.   Saxagliptin -Metformin  2.05-998 MG Tb24 Take 2 tablets by mouth daily in the afternoon.   sulfamethoxazole -trimethoprim  800-160 MG tablet Commonly known as: Bactrim  DS Take 1 tablet by mouth 3 (three) times a week.   Vitamin D3 75 MCG (3000 UT) Tabs Take 1 tablet by mouth daily.         OBJECTIVE:   Vital Signs: BP 110/72 (BP Location: Left Arm,  Patient Position: Sitting, Cuff Size: Small)   Pulse 83   Ht 5' 5 (1.651 m)   Wt 161 lb 12.8 oz (73.4 kg)   SpO2 98%   BMI 26.92 kg/m   Wt Readings from Last 3 Encounters:  07/05/23 161 lb 12.8 oz (73.4 kg)  06/21/23 162 lb 9.6 oz (73.8 kg)  02/21/23 170 lb 3.2 oz (77.2 kg)     Exam: General: Pt appears well and is in NAD  Lungs: Clear with good BS bilat   Heart: RRR   Abdomen:  soft, nontender  Extremities: No pretibial edema.   Neuro: MS is good with appropriate affect, pt is alert and Ox3    DM foot exam: 07/05/2023  The skin of the feet is intact without sores or ulcerations.  Right fifth toe swelling has been noted and minimal bruising The pedal pulses are 2+ on right and 2+ on left. The sensation is intact to a screening 5.07, 10 gram monofilament bilaterally     DATA REVIEWED:  Lab Results  Component Value Date   HGBA1C 6.0 (A) 07/05/2023   HGBA1C 7.0 (H) 02/21/2023   HGBA1C 6.8 (A) 02/16/2023    Latest Reference Range & Units 02/21/23 14:16  Sodium 134 - 144 mmol/L 140  Potassium 3.5 - 5.2 mmol/L 4.3  Chloride 96 - 106 mmol/L 99  CO2 20 - 29 mmol/L 24  Glucose 70 - 99 mg/dL 880 (H)  BUN 8 - 27 mg/dL 13  Creatinine 9.23 - 8.72 mg/dL 8.94  Calcium  8.6 - 10.2 mg/dL 9.3  BUN/Creatinine Ratio 10 - 24  12  eGFR >59 mL/min/1.73 81  Alkaline Phosphatase 44 - 121 IU/L 55  Albumin 3.9 - 4.9 g/dL 4.5  AST 0 - 40 IU/L 41 (H)  ALT 0 - 44 IU/L 48 (H)  Total Protein 6.0 - 8.5 g/dL 8.3  Total Bilirubin 0.0 - 1.2 mg/dL 0.4  Total CHOL/HDL Ratio 0.0 - 5.0 ratio 4.6  Cholesterol, Total 100 - 199 mg/dL 867  HDL Cholesterol >60 mg/dL 29 (L)  Triglycerides 0 - 149 mg/dL 813 (H)  VLDL Cholesterol Cal 5 - 40 mg/dL 32  LDL Chol Calc (NIH) 0 - 99 mg/dL 71   In office BG 820 mg/DL    ASSESSMENT / PLAN / RECOMMENDATIONS:   1) Type 2 Diabetes Mellitus, Optimally controlled, Without complications - Most recent A1c of 6.0 %. Goal A1c < 7.0 %.    -Glucose control is  optimal - The patient has been alternating between saxagliptin -metformin  1 day and metformin , repaglinide , pioglitazone  the second day.  I did discourage the patient from this regimen - He does endorse hypoglycemia - Patient will continue on saxagliptin -metformin  only  MEDICATIONS: -Take saxagliptin -metformin  2.05-998 mg , 2 tablets daily  EDUCATION / INSTRUCTIONS: BG monitoring instructions: Patient is instructed to check his blood sugars 2-3 times a week. Call [facility] if: BG persistently < 70  I reviewed the Rule of 15 for the treatment of hypoglycemia in detail with the patient. Literature supplied.    2) Diabetic complications:  Eye: Does not have known diabetic retinopathy.  Neuro/ Feet: Does not have known diabetic peripheral neuropathy .  Renal: Patient does not have known baseline CKD. He   is not on an ACEI/ARB at present.    3) Dyslipidemia   - LDL is above goal 12/2021 and 12/2022  at 129 mg/DL - Patient states his PCP has increased his rosuvastatin  Medication Continue rosuvastatin 10 mg daily  4) right fifth toe bruising:  - This has been improving - Patient to use ice as needed - Reassurance provided   5) left knee pain:  - Most likely arthritic in nature -Patient to take 500 mg of Tylenol 30 minutes before walks -Advised the patient to avoid NSAIDs -If symptoms do not improve or worsen, patient to follow-up with PCP     F/U in 6 months   Signed electronically by: Stefano Redgie Butts, MD  [facility]  [facility] [address] [place] [place], [place] [address] Phone: [phone] FAX: [phone]   CC: Nanci Senior, MD [address] [place] [address] Phone: [phone]  Fax: [phone]  Return to Endocrinology clinic as below: Future Appointments  Date Time Provider Department Center  07/05/2023  2:45 PM Eben Reyes BROCKS, MD [facility]

## 2023-07-06 LAB — T-HELPER CELLS (CD4) COUNT (NOT AT ARMC)
CD4 % Helper T Cell: 17 % — ABNORMAL LOW (ref 33–65)
CD4 T Cell Abs: 326 /uL — ABNORMAL LOW (ref 400–1790)

## 2023-07-06 LAB — HIV-1 RNA ULTRAQUANT REFLEX TO GENTYP+
HIV1 RNA # SerPl PCR: 40 {copies}/mL
HIV1 RNA Plas PCR-Log#: 1.602 {Log_copies}/mL

## 2023-07-19 ENCOUNTER — Other Ambulatory Visit: Payer: Self-pay | Admitting: Internal Medicine

## 2023-07-19 DIAGNOSIS — E119 Type 2 diabetes mellitus without complications: Secondary | ICD-10-CM

## 2023-10-16 ENCOUNTER — Telehealth: Payer: Self-pay

## 2023-10-16 NOTE — Telephone Encounter (Signed)
 Patient notified and will continue with the one tab after lunch.

## 2023-10-16 NOTE — Telephone Encounter (Signed)
 Patient states that for the past two week he has been taking the Saxagliptin -Metformin  2.05-998 MG 2 tabs after lunch and around 5pm he has been experiencing vomiting, dizziness, and nausea.  He often does not eat any dinner because of the symptoms. For the last two days he has been taking just 1 tab after lunch and he states that he doesn't seem to be having those symptoms. He would like to know if he should continue on this medication?

## 2024-01-29 ENCOUNTER — Ambulatory Visit: Admitting: Internal Medicine

## 2024-02-28 ENCOUNTER — Encounter: Admitting: Infectious Diseases

## 2024-03-27 ENCOUNTER — Ambulatory Visit: Admitting: Internal Medicine
